# Patient Record
Sex: Male | Born: 1970 | Race: White | Hispanic: No | Marital: Single | Smoking: Former smoker
Health system: Southern US, Community
[De-identification: ages and names within clinical notes are randomized; demographics above are authoritative.]

## PROBLEM LIST (undated history)

## (undated) DIAGNOSIS — G43909 Migraine, unspecified, not intractable, without status migrainosus: Secondary | ICD-10-CM

## (undated) DIAGNOSIS — K219 Gastro-esophageal reflux disease without esophagitis: Secondary | ICD-10-CM

## (undated) DIAGNOSIS — R569 Unspecified convulsions: Secondary | ICD-10-CM

## (undated) DIAGNOSIS — C801 Malignant (primary) neoplasm, unspecified: Secondary | ICD-10-CM

## (undated) HISTORY — DX: Migraine, unspecified, not intractable, without status migrainosus: G43.909

## (undated) HISTORY — DX: Gastro-esophageal reflux disease without esophagitis: K21.9

---

## 1998-01-28 ENCOUNTER — Encounter: Payer: Self-pay | Admitting: *Deleted

## 1998-01-28 ENCOUNTER — Emergency Department (HOSPITAL_COMMUNITY): Admission: EM | Admit: 1998-01-28 | Discharge: 1998-01-28 | Payer: Self-pay | Admitting: *Deleted

## 2003-05-09 ENCOUNTER — Emergency Department (HOSPITAL_COMMUNITY): Admission: EM | Admit: 2003-05-09 | Discharge: 2003-05-09 | Payer: Self-pay

## 2006-06-22 ENCOUNTER — Emergency Department: Payer: Self-pay | Admitting: Emergency Medicine

## 2007-04-03 ENCOUNTER — Emergency Department (HOSPITAL_COMMUNITY): Admission: EM | Admit: 2007-04-03 | Discharge: 2007-04-03 | Payer: Self-pay | Admitting: Emergency Medicine

## 2007-05-12 ENCOUNTER — Emergency Department (HOSPITAL_COMMUNITY): Admission: EM | Admit: 2007-05-12 | Discharge: 2007-05-12 | Payer: Self-pay | Admitting: Emergency Medicine

## 2008-12-14 ENCOUNTER — Inpatient Hospital Stay: Payer: Self-pay | Admitting: Internal Medicine

## 2009-12-16 ENCOUNTER — Emergency Department: Payer: Self-pay | Admitting: Emergency Medicine

## 2010-07-08 ENCOUNTER — Emergency Department: Payer: Self-pay | Admitting: Emergency Medicine

## 2010-10-05 ENCOUNTER — Emergency Department: Payer: Self-pay | Admitting: Internal Medicine

## 2011-01-16 LAB — WOUND CULTURE

## 2011-04-22 ENCOUNTER — Emergency Department: Payer: Self-pay

## 2011-06-06 ENCOUNTER — Emergency Department: Payer: Self-pay | Admitting: Emergency Medicine

## 2011-06-06 LAB — DRUG SCREEN, URINE
Amphetamines, Ur Screen: NEGATIVE (ref ?–1000)
Benzodiazepine, Ur Scrn: NEGATIVE (ref ?–200)
Cannabinoid 50 Ng, Ur ~~LOC~~: NEGATIVE (ref ?–50)
MDMA (Ecstasy)Ur Screen: NEGATIVE (ref ?–500)
Opiate, Ur Screen: NEGATIVE (ref ?–300)
Tricyclic, Ur Screen: NEGATIVE (ref ?–1000)

## 2011-06-06 LAB — COMPREHENSIVE METABOLIC PANEL
Albumin: 3.8 g/dL (ref 3.4–5.0)
Anion Gap: 12 (ref 7–16)
BUN: 11 mg/dL (ref 7–18)
Bilirubin,Total: 0.2 mg/dL (ref 0.2–1.0)
Calcium, Total: 8.2 mg/dL — ABNORMAL LOW (ref 8.5–10.1)
Chloride: 105 mmol/L (ref 98–107)
Creatinine: 1.02 mg/dL (ref 0.60–1.30)
EGFR (African American): >60
EGFR (Non-African Amer.): >60
Glucose: 151 mg/dL — ABNORMAL HIGH (ref 65–99)
Osmolality: 284 (ref 275–301)
Potassium: 3.8 mmol/L (ref 3.5–5.1)
SGPT (ALT): 23 U/L
Total Protein: 6.7 g/dL (ref 6.4–8.2)

## 2011-06-06 LAB — URINALYSIS, COMPLETE
Bilirubin,UR: NEGATIVE
Blood: NEGATIVE
Glucose,UR: NEGATIVE mg/dL (ref 0–75)
Leukocyte Esterase: NEGATIVE
Ph: 6 (ref 4.5–8.0)
Protein: NEGATIVE
Squamous Epithelial: NONE SEEN

## 2011-06-06 LAB — CBC
HCT: 39.1 % — ABNORMAL LOW (ref 40.0–52.0)
HGB: 13.3 g/dL (ref 13.0–18.0)
MCH: 32.7 pg (ref 26.0–34.0)
MCHC: 34 g/dL (ref 32.0–36.0)
MCV: 96 fL (ref 80–100)
RDW: 13.2 % (ref 11.5–14.5)

## 2011-06-06 LAB — CK TOTAL AND CKMB (NOT AT ARMC)
CK, Total: 263 U/L — ABNORMAL HIGH (ref 35–232)
CK-MB: 1.2 ng/mL (ref 0.5–3.6)

## 2011-06-06 LAB — PHENYTOIN LEVEL, TOTAL: Dilantin: <0.4 ug/mL — ABNORMAL LOW (ref 10.0–20.0)

## 2012-04-27 ENCOUNTER — Ambulatory Visit: Payer: Self-pay | Admitting: Neurology

## 2013-02-09 ENCOUNTER — Ambulatory Visit (INDEPENDENT_AMBULATORY_CARE_PROVIDER_SITE_OTHER): Payer: Self-pay

## 2013-02-09 ENCOUNTER — Other Ambulatory Visit: Payer: Self-pay | Admitting: Adult Health

## 2013-02-09 DIAGNOSIS — R52 Pain, unspecified: Secondary | ICD-10-CM

## 2013-02-09 DIAGNOSIS — M545 Low back pain: Secondary | ICD-10-CM

## 2013-09-18 ENCOUNTER — Encounter (HOSPITAL_COMMUNITY): Payer: Self-pay | Admitting: Emergency Medicine

## 2013-09-18 ENCOUNTER — Emergency Department (HOSPITAL_COMMUNITY)
Admission: EM | Admit: 2013-09-18 | Discharge: 2013-09-18 | Disposition: A | Discharge status: Home / Self Care | Payer: BC Managed Care – PPO | Attending: Emergency Medicine | Admitting: Emergency Medicine

## 2013-09-18 ENCOUNTER — Emergency Department (HOSPITAL_COMMUNITY): Payer: BC Managed Care – PPO

## 2013-09-18 DIAGNOSIS — S2231XA Fracture of one rib, right side, initial encounter for closed fracture: Secondary | ICD-10-CM

## 2013-09-18 DIAGNOSIS — S99919A Unspecified injury of unspecified ankle, initial encounter: Secondary | ICD-10-CM

## 2013-09-18 DIAGNOSIS — S99929A Unspecified injury of unspecified foot, initial encounter: Secondary | ICD-10-CM

## 2013-09-18 DIAGNOSIS — F17200 Nicotine dependence, unspecified, uncomplicated: Secondary | ICD-10-CM | POA: Insufficient documentation

## 2013-09-18 DIAGNOSIS — Z23 Encounter for immunization: Secondary | ICD-10-CM | POA: Insufficient documentation

## 2013-09-18 DIAGNOSIS — Y92410 Unspecified street and highway as the place of occurrence of the external cause: Secondary | ICD-10-CM | POA: Insufficient documentation

## 2013-09-18 DIAGNOSIS — S8990XA Unspecified injury of unspecified lower leg, initial encounter: Secondary | ICD-10-CM | POA: Insufficient documentation

## 2013-09-18 DIAGNOSIS — M25561 Pain in right knee: Secondary | ICD-10-CM

## 2013-09-18 DIAGNOSIS — S20219A Contusion of unspecified front wall of thorax, initial encounter: Secondary | ICD-10-CM | POA: Insufficient documentation

## 2013-09-18 DIAGNOSIS — Y9389 Activity, other specified: Secondary | ICD-10-CM | POA: Insufficient documentation

## 2013-09-18 HISTORY — DX: Unspecified convulsions: R56.9

## 2013-09-18 MED ORDER — OXYCODONE-ACETAMINOPHEN 5-325 MG PO TABS: 1.0000 | ORAL_TABLET | Freq: Four times a day (QID) | ORAL | Status: DC | PRN

## 2013-09-18 MED ORDER — TETANUS-DIPHTH-ACELL PERTUSSIS 5-2.5-18.5 LF-MCG/0.5 IM SUSP
0.5000 mL | Freq: Once | INTRAMUSCULAR | Status: AC
Start: 2013-09-18 — End: 2013-09-18
  Administered 2013-09-18: 0.5 mL via INTRAMUSCULAR
  Filled 2013-09-18: qty 0.5

## 2013-09-18 MED ORDER — OXYCODONE-ACETAMINOPHEN 5-325 MG PO TABS
2.0000 | ORAL_TABLET | Freq: Once | ORAL | Status: AC
  Administered 2013-09-18: 2 via ORAL
  Filled 2013-09-18: qty 2

## 2013-09-18 MED ORDER — HYDROCODONE-ACETAMINOPHEN 7.5-325 MG/15ML PO SOLN: 10.0000 mL | Freq: Once | ORAL | Status: DC

## 2013-09-18 MED ORDER — IBUPROFEN 800 MG PO TABS: 800.0000 mg | ORAL_TABLET | Freq: Three times a day (TID) | ORAL | Status: DC

## 2013-09-18 NOTE — Discharge Instructions (Signed)
Please follow up with your primary care physician in 1-2 days. If you do not have one please call the [facility] number listed above. Please follow up with Dr. Rhona Raider to schedule a follow up appointment if your knee is not improving. Please follow RICE method below. Please take pain medication and/or muscle relaxants as prescribed and as needed for pain. Please do not drive on narcotic pain medication or on muscle relaxants. Please read all discharge instructions and return precautions.   Rib Fracture A rib fracture is a break or crack in one of the bones of the ribs. The ribs are a group of long, curved bones that wrap around your chest and attach to your spine. They protect your lungs and other organs in the chest cavity. A broken or cracked rib is often painful, but most do not cause other problems. Most rib fractures heal on their own over time. However, rib fractures can be more serious if multiple ribs are broken or if broken ribs move out of place and push against other structures. CAUSES   A direct blow to the chest. For example, this could happen during contact sports, a car accident, or a fall against a hard object.  Repetitive movements with high force, such as pitching a baseball or having severe coughing spells. SYMPTOMS   Pain when you breathe in or cough.  Pain when someone presses on the injured area. DIAGNOSIS  Your caregiver will perform a physical exam. Various imaging tests may be ordered to confirm the diagnosis and to look for related injuries. These tests may include a chest X-ray, computed tomography (CT), magnetic resonance imaging (MRI), or a bone scan. TREATMENT  Rib fractures usually heal on their own in 1 3 months. The longer healing period is often associated with a continued cough or other aggravating activities. During the healing period, pain control is very important. Medication is usually given to control pain. Hospitalization or surgery may  be needed for more severe injuries, such as those in which multiple ribs are broken or the ribs have moved out of place.  HOME CARE INSTRUCTIONS   Avoid strenuous activity and any activities or movements that cause pain. Be careful during activities and avoid bumping the injured rib.  Gradually increase activity as directed by your caregiver.  Only take over-the-counter or prescription medications as directed by your caregiver. Do not take other medications without asking your caregiver first.  Apply ice to the injured area for the first 1 2 days after you have been treated or as directed by your caregiver. Applying ice helps to reduce inflammation and pain.  Put ice in a plastic bag.  Place a towel between your skin and the bag.   Leave the ice on for 15 20 minutes at a time, every 2 hours while you are awake.  Perform deep breathing as directed by your caregiver. This will help prevent pneumonia, which is a common complication of a broken rib. Your caregiver may instruct you to:  Take deep breaths several times a day.  Try to cough several times a day, holding a pillow against the injured area.  Use a device called an incentive spirometer to practice deep breathing several times a day.  Drink enough fluids to keep your urine clear or pale yellow. This will help you avoid constipation.   Do not wear a rib belt or binder. These restrict breathing, which can lead to pneumonia.  SEEK IMMEDIATE MEDICAL CARE IF:  You have a fever.   You have difficulty breathing or shortness of breath.   You develop a continual cough, or you cough up thick or bloody sputum.  You feel sick to your stomach (nausea), throw up (vomit), or have abdominal pain.   You have worsening pain not controlled with medications.  MAKE SURE YOU:  Understand these instructions.  Will watch your condition.  Will get help right away if you are not doing well or get worse. Document Released: 03/30/2005  Document Revised: 11/30/2012 Document Reviewed: 06/01/2012 [facility] Patient Information 2014 [place], [place].   Knee Pain Knee pain can be a result of an injury or other medical conditions. Treatment will depend on the cause of your pain. HOME CARE  Only take medicine as told by your doctor.  Keep a healthy weight. Being overweight can make the knee hurt more.  Stretch before exercising or playing sports.  If there is constant knee pain, change the way you exercise. Ask your doctor for advice.  Make sure shoes fit well. Choose the right shoe for the sport or activity.  Protect your knees. Wear kneepads if needed.  Rest when you are tired. GET HELP RIGHT AWAY IF:   Your knee pain does not stop.  Your knee pain does not get better.  Your knee joint feels hot to the touch.  You have a fever. MAKE SURE YOU:   Understand these instructions.  Will watch this condition.  Will get help right away if you are not doing well or get worse. Document Released: 06/26/2008 Document Revised: 06/22/2011 Document Reviewed: 06/26/2008 [facility] Patient Information 2014 [address], [place].

## 2013-09-18 NOTE — ED Notes (Signed)
PA notified of elevated BP.
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
## 2013-09-21 NOTE — ED Provider Notes (Signed)
Medical screening examination/treatment/procedure(s) were performed by non-physician practitioner and as supervising physician I was immediately available for consultation/collaboration.   EKG Interpretation None       Varney Biles, MD 09/21/13 0104

## 2013-10-31 DIAGNOSIS — R519 Headache, unspecified: Secondary | ICD-10-CM | POA: Insufficient documentation

## 2013-10-31 DIAGNOSIS — R569 Unspecified convulsions: Secondary | ICD-10-CM | POA: Insufficient documentation

## 2014-06-21 DIAGNOSIS — G479 Sleep disorder, unspecified: Secondary | ICD-10-CM | POA: Insufficient documentation

## 2014-10-19 DIAGNOSIS — F10939 Alcohol use, unspecified with withdrawal, unspecified: Secondary | ICD-10-CM | POA: Insufficient documentation

## 2015-08-31 ENCOUNTER — Encounter (HOSPITAL_COMMUNITY): Payer: Self-pay

## 2015-08-31 ENCOUNTER — Emergency Department (HOSPITAL_COMMUNITY): Payer: Self-pay

## 2015-08-31 ENCOUNTER — Emergency Department (HOSPITAL_COMMUNITY)
Admission: EM | Admit: 2015-08-31 | Discharge: 2015-08-31 | Disposition: A | Discharge status: Home / Self Care | Payer: Self-pay | Attending: Emergency Medicine | Admitting: Emergency Medicine

## 2015-08-31 DIAGNOSIS — Z79899 Other long term (current) drug therapy: Secondary | ICD-10-CM | POA: Insufficient documentation

## 2015-08-31 DIAGNOSIS — R200 Anesthesia of skin: Secondary | ICD-10-CM | POA: Insufficient documentation

## 2015-08-31 DIAGNOSIS — R269 Unspecified abnormalities of gait and mobility: Secondary | ICD-10-CM | POA: Insufficient documentation

## 2015-08-31 DIAGNOSIS — M79605 Pain in left leg: Secondary | ICD-10-CM

## 2015-08-31 DIAGNOSIS — R000 Tachycardia, unspecified: Secondary | ICD-10-CM | POA: Insufficient documentation

## 2015-08-31 DIAGNOSIS — M79672 Pain in left foot: Secondary | ICD-10-CM | POA: Insufficient documentation

## 2015-08-31 DIAGNOSIS — Z9889 Other specified postprocedural states: Secondary | ICD-10-CM | POA: Insufficient documentation

## 2015-08-31 DIAGNOSIS — R062 Wheezing: Secondary | ICD-10-CM | POA: Insufficient documentation

## 2015-08-31 DIAGNOSIS — F17200 Nicotine dependence, unspecified, uncomplicated: Secondary | ICD-10-CM | POA: Insufficient documentation

## 2015-08-31 MED ORDER — HYDROCODONE-ACETAMINOPHEN 5-325 MG PO TABS: 2.0000 | ORAL_TABLET | ORAL | Status: DC | PRN

## 2015-08-31 MED ORDER — ONDANSETRON HCL 4 MG/2ML IJ SOLN
INTRAMUSCULAR | Status: AC
  Filled 2015-08-31: qty 2

## 2015-08-31 MED ORDER — HYDROMORPHONE HCL 1 MG/ML IJ SOLN
1.0000 mg | Freq: Once | INTRAMUSCULAR | Status: AC
  Administered 2015-08-31: 1 mg via INTRAMUSCULAR
  Filled 2015-08-31: qty 1

## 2015-08-31 MED ORDER — SODIUM CHLORIDE 0.9 % IV BOLUS (SEPSIS)
1000.0000 mL | Freq: Once | INTRAVENOUS | Status: AC
  Administered 2015-08-31: 1000 mL via INTRAVENOUS

## 2015-08-31 MED ORDER — KETOROLAC TROMETHAMINE 30 MG/ML IJ SOLN
30.0000 mg | Freq: Once | INTRAMUSCULAR | Status: AC
  Administered 2015-08-31: 30 mg via INTRAVENOUS
  Filled 2015-08-31: qty 1

## 2015-08-31 MED ORDER — ONDANSETRON HCL 4 MG/2ML IJ SOLN
4.0000 mg | Freq: Once | INTRAMUSCULAR | Status: AC
  Administered 2015-08-31: 4 mg via INTRAVENOUS

## 2015-08-31 MED ORDER — HYDROMORPHONE HCL 1 MG/ML IJ SOLN
1.0000 mg | Freq: Once | INTRAMUSCULAR | Status: AC
  Administered 2015-08-31: 1 mg via INTRAVENOUS
  Filled 2015-08-31: qty 1

## 2015-08-31 NOTE — ED Notes (Signed)
Pt states that he is nauseous since walking to the restroom. PA notified and gave verbal order for zofran. Pt is not vomiting.

## 2015-08-31 NOTE — ED Notes (Signed)
Patient here with left ankle/lower leg pain after feeding animals this afternoon. Denies fall but did kick bucket

## 2015-08-31 NOTE — Discharge Instructions (Signed)

## 2015-08-31 NOTE — ED Notes (Signed)
Pt verbalized understanding of d/c instructions and has no further questions. Pt stable and NAD. Pt d/c home with mother driving.

## 2015-08-31 NOTE — Progress Notes (Signed)
Orthopedic Tech Progress Note Patient Details:  Matthew Nguyen 28-Mar-1971 [id number]  Ortho Devices Type of Ortho Device: CAM walker, Crutches Ortho Device/Splint Interventions: Application   [name] 08/31/2015, 6:29 PM

## 2015-08-31 NOTE — ED Notes (Signed)
Pt given ice water to drink

## 2015-08-31 NOTE — ED Provider Notes (Signed)
CSN: [id number]     Arrival date & time 08/31/15  1542 History   First MD Initiated Contact with Patient 08/31/15 1611     Chief Complaint  Patient presents with  . ankle injury    HPI Comments: 45 year old male presents with acute onset of left foot pain for the past several hours. Past medical history significant for surgery on his left foot ligaments following an injury about 20 years ago. He states that he was walking outside when he started to have intense pain in his foot. The pain radiates up his leg. It is sharp and stabbing with some numbness and tingling. He was unable to bear weight afterwards. Denies injury. He has never had this pain before.   Past Medical History  Diagnosis Date  . Seizure [facility])    History reviewed. No pertinent past surgical history. No family history on file. Social History  Substance Use Topics  . Smoking status: Current Every Day Smoker  . Smokeless tobacco: None  . Alcohol Use: Yes    Review of Systems  Musculoskeletal: Positive for arthralgias and gait problem.  Neurological: Positive for numbness.  All other systems reviewed and are negative.     Allergies  Morphine  Home Medications   Prior to Admission medications   Medication Sig Start Date End Date Taking? Authorizing Provider  amitriptyline (ELAVIL) 100 MG tablet Take 100 mg by mouth at bedtime. 04/25/15  Yes Historical Provider, MD  Aspirin-Acetaminophen-Caffeine 260-130-16 MG TABS Take 2 packets by mouth daily as needed (for headaches).   Yes Historical Provider, MD  lamoTRIgine (LAMICTAL) 200 MG tablet Take 200 mg by mouth 2 (two) times daily. 07/05/15  Yes Historical Provider, MD  levETIRAcetam (KEPPRA) 750 MG tablet Take 1,500 mg by mouth 2 (two) times daily. 08/12/15  Yes Historical Provider, MD  venlafaxine (EFFEXOR) 75 MG tablet Take 75 mg by mouth every morning.  08/12/15  Yes Historical Provider, MD  zolpidem (AMBIEN) 5 MG tablet Take 5 mg by mouth at bedtime. 08/12/15 09/11/15 Yes  Historical Provider, MD  HYDROcodone-acetaminophen (NORCO/VICODIN) 5-325 MG tablet Take 2 tablets by mouth every 4 (four) hours as needed. 08/31/15   Recardo Evangelist, PA-C   BP 149/102 mmHg  Pulse 96  Temp(Src) 99.6 F (37.6 C) (Oral)  Resp 16  SpO2 97%   Physical Exam  Constitutional: He is oriented to person, place, and time. He appears well-developed and well-nourished. He appears distressed.  HENT:  Head: Normocephalic and atraumatic.  Eyes: Conjunctivae are normal. Pupils are equal, round, and reactive to light. Right eye exhibits no discharge. Left eye exhibits no discharge. No scleral icterus.  Neck: Normal range of motion.  Cardiovascular: Regular rhythm.  Tachycardia present.  Exam reveals no gallop and no friction rub.   No murmur heard. Pulmonary/Chest: Effort normal. No respiratory distress. He has wheezes. He has no rales. He exhibits no tenderness.  Mild expiratory wheezes  Musculoskeletal:  Left foot, ankle, leg: No obvious deformity or swelling. No pallor. Foot is not cold to touch. N/V intact. 1+DP and PT pulse. FROM of toes and ankle. No calf tenderness, swelling, or erythema.  Neurological: He is alert and oriented to person, place, and time.  Skin: Skin is warm and dry. He is not diaphoretic.  Psychiatric: He has a normal mood and affect.  Nursing note and vitals reviewed.   ED Course  Procedures (including critical care time) Labs Review Labs Reviewed - No data to display  Imaging Review Dg Tibia/fibula Left  08/31/2015  CLINICAL DATA:  Left ankle pain EXAM: LEFT TIBIA AND FIBULA - 2 VIEW COMPARISON:  None. FINDINGS: Distal tibia/fibula is excluded but evaluated on dedicated ankle radiographs. No fracture or dislocation is seen. Knee joint spaces are preserved. Vascular calcifications. IMPRESSION: No fracture or dislocation is seen. Distal tibia/fibula is evaluated on dedicated ankle radiographs. Electronically Signed   By: Julian Hy M.D.   On:  08/31/2015 17:18   Dg Ankle Complete Left  08/31/2015  CLINICAL DATA:  Pain after fall a few days ago. EXAM: LEFT ANKLE COMPLETE - 3+ VIEW COMPARISON:  None. FINDINGS: Well corticated calcifications distal to the fibula consistent with an remote avulsion injury. No acute fractures or dislocations are identified. IMPRESSION: No acute fracture. Electronically Signed   By: Dorise Bullion III M.D   On: 08/31/2015 17:15   Dg Foot Complete Left  08/31/2015  CLINICAL DATA:  Acute onset of left foot pain earlier today. No known injuries. EXAM: LEFT FOOT - COMPLETE 3+ VIEW COMPARISON:  None. FINDINGS: No evidence of acute fracture or dislocation. Joint spaces well preserved. Well-preserved bone mineral density. No intrinsic osseous abnormalities. IMPRESSION: Normal examination. Electronically Signed   By: Evangeline Dakin M.D.   On: 08/31/2015 19:55   I have personally reviewed and evaluated these images and lab results as part of my medical decision-making.   EKG Interpretation None      MDM   Final diagnoses:  Left leg pain   45 year old male presents with acute onset of left foot pain. Unclear etiology. Pain is possibly due to his old injury. X-rays are negative for any acute processes. He is tachycardic initially however this has improved with 1 L normal saline. All other vital signs are within normal limits and stable. Dialudid x 2 given with minimal relief. Pt is nauseous after narcotic meds. Zofran given. Recommend ortho follow up. Patient is NAD, non-toxic, with stable VS. Patient is informed of clinical course, understands medical decision making process, and agrees with plan. Opportunity for questions provided and all questions answered. Return precautions given.    Recardo Evangelist, PA-C 08/31/15 2011  Nat Christen, MD 08/31/15 [id number]

## 2015-08-31 NOTE — ED Notes (Signed)
Ortho in room

## 2015-10-29 DIAGNOSIS — R569 Unspecified convulsions: Secondary | ICD-10-CM | POA: Insufficient documentation

## 2016-05-05 DIAGNOSIS — G40219 Localization-related (focal) (partial) symptomatic epilepsy and epileptic syndromes with complex partial seizures, intractable, without status epilepticus: Secondary | ICD-10-CM | POA: Insufficient documentation

## 2017-08-31 ENCOUNTER — Encounter: Payer: Self-pay | Admitting: Neurology

## 2017-10-25 ENCOUNTER — Encounter: Payer: Self-pay | Admitting: Neurology

## 2017-10-25 ENCOUNTER — Encounter

## 2017-10-25 ENCOUNTER — Ambulatory Visit (INDEPENDENT_AMBULATORY_CARE_PROVIDER_SITE_OTHER): Payer: Medicare Other | Admitting: Neurology

## 2017-10-25 ENCOUNTER — Other Ambulatory Visit: Payer: Self-pay

## 2017-10-25 VITALS — BP 148/92 | HR 123 | Ht 68.5 in | Wt 159.0 lb

## 2017-10-25 DIAGNOSIS — R51 Headache: Secondary | ICD-10-CM | POA: Diagnosis not present

## 2017-10-25 DIAGNOSIS — G40009 Localization-related (focal) (partial) idiopathic epilepsy and epileptic syndromes with seizures of localized onset, not intractable, without status epilepticus: Secondary | ICD-10-CM

## 2017-10-25 DIAGNOSIS — R519 Headache, unspecified: Secondary | ICD-10-CM

## 2017-10-25 MED ORDER — SUMATRIPTAN SUCCINATE 50 MG PO TABS: ORAL_TABLET | ORAL | 6 refills | Status: DC

## 2017-10-25 MED ORDER — ZONISAMIDE 100 MG PO CAPS: ORAL_CAPSULE | ORAL | 6 refills | Status: DC
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
## 2017-10-27 DIAGNOSIS — G40009 Localization-related (focal) (partial) idiopathic epilepsy and epileptic syndromes with seizures of localized onset, not intractable, without status epilepticus: Secondary | ICD-10-CM | POA: Insufficient documentation

## 2017-10-27 DIAGNOSIS — R519 Headache, unspecified: Secondary | ICD-10-CM | POA: Insufficient documentation

## 2017-10-27 DIAGNOSIS — R51 Headache: Secondary | ICD-10-CM

## 2017-11-19 ENCOUNTER — Ambulatory Visit
Admission: RE | Admit: 2017-11-19 | Discharge: 2017-11-19 | Disposition: A | Discharge status: Home / Self Care | Payer: Medicare Other | Source: Ambulatory Visit | Attending: Neurology | Admitting: Neurology

## 2017-11-19 ENCOUNTER — Other Ambulatory Visit: Payer: Self-pay | Admitting: Neurology

## 2017-11-19 DIAGNOSIS — Z77018 Contact with and (suspected) exposure to other hazardous metals: Secondary | ICD-10-CM

## 2017-11-19 DIAGNOSIS — G40009 Localization-related (focal) (partial) idiopathic epilepsy and epileptic syndromes with seizures of localized onset, not intractable, without status epilepticus: Secondary | ICD-10-CM

## 2017-11-19 DIAGNOSIS — Z135 Encounter for screening for eye and ear disorders: Secondary | ICD-10-CM | POA: Diagnosis not present

## 2017-11-19 DIAGNOSIS — R51 Headache: Secondary | ICD-10-CM | POA: Diagnosis not present

## 2017-11-19 MED ORDER — GADOBENATE DIMEGLUMINE 529 MG/ML IV SOLN
15.0000 mL | Freq: Once | INTRAVENOUS | Status: AC | PRN
  Administered 2017-11-19: 15 mL via INTRAVENOUS

## 2017-11-22 ENCOUNTER — Telehealth: Payer: Self-pay | Admitting: Neurology

## 2017-11-22 MED ORDER — AMITRIPTYLINE HCL 50 MG PO TABS: ORAL_TABLET | ORAL | 11 refills | Status: DC

## 2017-11-22 NOTE — Telephone Encounter (Signed)
Ok to restart amitriptyline, let's do amitriptyline 50mg  qhs 1 tab qhs x 1 week, then increase to 2 tabs qhs. He was previously taking 100mg  at bedtime but would start on lower dose first because he has not taken it in a while. Also pls remind to minimize and cut down on BC powders otherwise headaches will never get better whatever medication we add on. Thanks

## 2017-11-22 NOTE — Telephone Encounter (Signed)
Patient called to say that the Sumatriptan is not helping his Headaches. He said that the Amitriptyline did help in the past. He uses [facility] in [place].  Please Call. Thanks

## 2017-11-22 NOTE — Telephone Encounter (Signed)
Spoke with pt relaying message below as well as his MRI results.  Verified preferred pharmacy.  Rx sent

## 2017-11-22 NOTE — Telephone Encounter (Signed)
pls advise

## 2018-01-18 ENCOUNTER — Other Ambulatory Visit: Payer: Self-pay

## 2018-01-18 ENCOUNTER — Telehealth: Payer: Self-pay | Admitting: Neurology

## 2018-01-18 MED ORDER — ZONISAMIDE 100 MG PO CAPS: ORAL_CAPSULE | ORAL | 6 refills | Status: DC

## 2018-01-18 MED ORDER — SUMATRIPTAN SUCCINATE 50 MG PO TABS: ORAL_TABLET | ORAL | 6 refills | Status: DC

## 2018-01-18 MED ORDER — AMITRIPTYLINE HCL 50 MG PO TABS: ORAL_TABLET | ORAL | 11 refills | Status: DC

## 2018-01-18 NOTE — Telephone Encounter (Signed)
Patient's sister called and had to reschedule his appointment until January. She wanted to know could he still have his medication refilled until that appointment? He uses the pharmacy in [facility]. Thanks

## 2018-01-18 NOTE — Telephone Encounter (Signed)
Rxs sent

## 2018-01-20 ENCOUNTER — Ambulatory Visit: Payer: Medicare Other | Admitting: Neurology

## 2018-05-05 ENCOUNTER — Ambulatory Visit (INDEPENDENT_AMBULATORY_CARE_PROVIDER_SITE_OTHER): Payer: Medicare HMO | Admitting: Neurology

## 2018-05-05 VITALS — BP 130/84 | HR 111 | Ht 69.0 in | Wt 160.0 lb

## 2018-05-05 DIAGNOSIS — R51 Headache: Secondary | ICD-10-CM

## 2018-05-05 DIAGNOSIS — R519 Headache, unspecified: Secondary | ICD-10-CM

## 2018-05-05 DIAGNOSIS — G40009 Localization-related (focal) (partial) idiopathic epilepsy and epileptic syndromes with seizures of localized onset, not intractable, without status epilepticus: Secondary | ICD-10-CM

## 2018-05-05 MED ORDER — NARATRIPTAN HCL 2.5 MG PO TABS: ORAL_TABLET | ORAL | 11 refills | Status: DC

## 2018-05-05 MED ORDER — AMITRIPTYLINE HCL 50 MG PO TABS: ORAL_TABLET | ORAL | 11 refills | Status: DC

## 2018-05-05 MED ORDER — ZONISAMIDE 100 MG PO CAPS: ORAL_CAPSULE | ORAL | 11 refills | Status: DC
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
## 2018-05-11 ENCOUNTER — Encounter: Payer: Self-pay | Admitting: Neurology

## 2018-05-26 ENCOUNTER — Encounter: Payer: Self-pay | Admitting: General Surgery

## 2018-05-26 ENCOUNTER — Ambulatory Visit: Payer: Medicare HMO | Admitting: General Surgery

## 2018-05-26 VITALS — BP 133/100 | HR 111 | Temp 98.6°F | Resp 18 | Wt 159.8 lb

## 2018-05-26 DIAGNOSIS — K429 Umbilical hernia without obstruction or gangrene: Secondary | ICD-10-CM

## 2018-05-26 MED ORDER — OXYCODONE HCL 5 MG PO TABS: 5.0000 mg | ORAL_TABLET | ORAL | 0 refills | Status: DC | PRN

## 2018-05-26 NOTE — Patient Instructions (Signed)
Umbilical Hernia, Adult  A hernia is a bulge of tissue that pushes through an opening between muscles. An umbilical hernia happens in the abdomen, near the belly button (umbilicus). The hernia may contain tissues from the small intestine, large intestine, or fatty tissue covering the intestines (omentum). Umbilical hernias in adults tend to get worse over time, and they require surgical treatment. There are several types of umbilical hernias. You may have:  A hernia located just above or below the umbilicus (indirect hernia). This is the most common type of umbilical hernia in adults.  A hernia that forms through an opening formed by the umbilicus (direct hernia).  A hernia that comes and goes (reducible hernia). A reducible hernia may be visible only when you strain, lift something heavy, or cough. This type of hernia can be pushed back into the abdomen (reduced).  A hernia that traps abdominal tissue inside the hernia (incarcerated hernia). This type of hernia cannot be reduced.  A hernia that cuts off blood flow to the tissues inside the hernia (strangulated hernia). The tissues can start to die if this happens. This type of hernia requires emergency treatment. What are the causes? An umbilical hernia happens when tissue inside the abdomen presses on a weak area of the abdominal muscles. What increases the risk? You may have a greater risk of this condition if you:  Are obese.  Have had several pregnancies.  Have a buildup of fluid inside your abdomen (ascites).  Have had surgery that weakens the abdominal muscles. What are the signs or symptoms? The main symptom of this condition is a painless bulge at or near the belly button. A reducible hernia may be visible only when you strain, lift something heavy, or cough. Other symptoms may include:  Dull pain.  A feeling of pressure. Symptoms of a strangulated hernia may include:  Pain that gets increasingly worse.  Nausea and  vomiting.  Pain when pressing on the hernia.  Skin over the hernia becoming red or purple.  Constipation.  Blood in the stool. How is this diagnosed? This condition may be diagnosed based on:  A physical exam. You may be asked to cough or strain while standing. These actions increase the pressure inside your abdomen and force the hernia through the opening in your muscles. Your health care provider may try to reduce the hernia by pressing on it.  Your symptoms and medical history. How is this treated? Surgery is the only treatment for an umbilical hernia. Surgery for a strangulated hernia is done as soon as possible. If you have a small hernia that is not incarcerated, you may need to lose weight before having surgery. Follow these instructions at home:  Lose weight, if told by your health care provider.  Do not try to push the hernia back in.  Watch your hernia for any changes in color or size. Tell your health care provider if any changes occur.  You may need to avoid activities that increase pressure on your hernia.  Do not lift anything that is heavier than 10 lb (4.5 kg) until your health care provider says that this is safe.  Take over-the-counter and prescription medicines only as told by your health care provider.  Keep all follow-up visits as told by your health care provider. This is important. Contact a health care provider if:  Your hernia gets larger.  Your hernia becomes painful. Get help right away if:  You develop sudden, severe pain near the area of your hernia.    You have pain as well as nausea or vomiting.  You have pain and the skin over your hernia changes color.  You develop a fever. This information is not intended to replace advice given to you by your health care provider. Make sure you discuss any questions you have with your health care provider. Document Released: 08/30/2015 Document Revised: 05/12/2017 Document Reviewed: 09/28/2016 Elsevier  Interactive Patient Education  2019 Elsevier Inc.  

## 2018-05-26 NOTE — Progress Notes (Signed)
Matthew Nguyen [place]; [id number]; July 31, 1970   HPI Patient is a 48 year old white male who was referred to my care by Dr. Yong Channel for evaluation treatment of an umbilical hernia.  Patient states he has had an umbilical hernia for many years.  Over the past year, he is noted decreasing in size of the hernia.  Is causing him discomfort especially when straining or coughing.  No nausea or vomiting have been noted.  He states his pain is 2 out of 10. Past Medical History:  Diagnosis Date  . Seizure [facility])     History reviewed. No pertinent surgical history.  History reviewed. No pertinent family history.  Current Outpatient Medications on File Prior to Visit  Medication Sig Dispense Refill  . amitriptyline (ELAVIL) 50 MG tablet Take 3 tablets every night 90 tablet 11  . naratriptan (AMERGE) 2.5 MG tablet Take 1 tablet at onset of headache. Do not take more than 2-3 a week 10 tablet 11  . zolpidem (AMBIEN) 5 MG tablet Take 5 mg by mouth at bedtime.    Marland Kitchen zonisamide (ZONEGRAN) 100 MG capsule Take 4 caps every night 120 capsule 11   No current facility-administered medications on file prior to visit.     Allergies  Allergen Reactions  . Morphine Nausea Only    Social History   Substance and Sexual Activity  Alcohol Use Yes    Social History   Tobacco Use  Smoking Status Current Every Day Smoker  Smokeless Tobacco Never Used    Review of Systems  Constitutional: Negative.   HENT: Negative.   Eyes: Negative.   Respiratory: Negative.   Cardiovascular: Negative.   Gastrointestinal: Positive for abdominal pain.  Genitourinary: Negative.   Musculoskeletal: Negative.   Skin: Negative.   Neurological: Positive for seizures.  Endo/Heme/Allergies: Negative.   Psychiatric/Behavioral: Negative.     Objective   Vitals:   05/26/18 1034  BP: (!) 133/100  Pulse: (!) 111  Resp: 18  Temp: 98.6 F (37 C)    Physical Exam Vitals signs reviewed.  Constitutional:    Appearance: Normal appearance. He is normal weight. He is not ill-appearing.  HENT:     Head: Normocephalic and atraumatic.  Cardiovascular:     Rate and Rhythm: Normal rate and regular rhythm.     Heart sounds: Normal heart sounds. No murmur. No friction rub. No gallop.   Pulmonary:     Effort: Pulmonary effort is normal. No respiratory distress.     Breath sounds: Normal breath sounds. No stridor. No wheezing, rhonchi or rales.  Abdominal:     General: Abdomen is flat. Bowel sounds are normal. There is no distension.     Palpations: Abdomen is soft. There is no mass.     Tenderness: There is no abdominal tenderness. There is no guarding or rebound.     Hernia: A hernia is present.     Comments: Reducible umbilical hernia present.  Skin:    General: Skin is warm and dry.  Neurological:     Mental Status: He is alert and oriented to person, place, and time.   Primary care notes reviewed  Assessment  Umbilical hernia Plan   Patient is scheduled for an umbilical herniorrhaphy with mesh on 06/06/2018.  The risks and benefits of the procedure including bleeding, infection, mesh use, and the possibility of recurrence of the hernia were fully explained to the patient, who gave informed consent.  As the patient is having periumbilical pain, oxycodone has been prescribed.  He does  realize this may lower his seizure threshold.  He has taken this before.

## 2018-05-26 NOTE — H&P (Signed)
Matthew Nguyen; [id number]; September 17, 1970   HPI Patient is a 48 year old white male who was referred to my care by Dr. Yong Channel for evaluation treatment of an umbilical hernia.  Patient states he has had an umbilical hernia for many years.  Over the past year, he is noted decreasing in size of the hernia.  Is causing him discomfort especially when straining or coughing.  No nausea or vomiting have been noted.  He states his pain is 2 out of 10. Past Medical History:  Diagnosis Date  . Seizure [facility])     History reviewed. No pertinent surgical history.  History reviewed. No pertinent family history.  Current Outpatient Medications on File Prior to Visit  Medication Sig Dispense Refill  . amitriptyline (ELAVIL) 50 MG tablet Take 3 tablets every night 90 tablet 11  . naratriptan (AMERGE) 2.5 MG tablet Take 1 tablet at onset of headache. Do not take more than 2-3 a week 10 tablet 11  . zolpidem (AMBIEN) 5 MG tablet Take 5 mg by mouth at bedtime.    Marland Kitchen zonisamide (ZONEGRAN) 100 MG capsule Take 4 caps every night 120 capsule 11   No current facility-administered medications on file prior to visit.     Allergies  Allergen Reactions  . Morphine Nausea Only    Social History   Substance and Sexual Activity  Alcohol Use Yes    Social History   Tobacco Use  Smoking Status Current Every Day Smoker  Smokeless Tobacco Never Used    Review of Systems  Constitutional: Negative.   HENT: Negative.   Eyes: Negative.   Respiratory: Negative.   Cardiovascular: Negative.   Gastrointestinal: Positive for abdominal pain.  Genitourinary: Negative.   Musculoskeletal: Negative.   Skin: Negative.   Neurological: Positive for seizures.  Endo/Heme/Allergies: Negative.   Psychiatric/Behavioral: Negative.     Objective   Vitals:   05/26/18 1034  BP: (!) 133/100  Pulse: (!) 111  Resp: 18  Temp: 98.6 F (37 C)    Physical Exam Vitals signs reviewed.  Constitutional:    Appearance: Normal appearance. He is normal weight. He is not ill-appearing.  HENT:     Head: Normocephalic and atraumatic.  Cardiovascular:     Rate and Rhythm: Normal rate and regular rhythm.     Heart sounds: Normal heart sounds. No murmur. No friction rub. No gallop.   Pulmonary:     Effort: Pulmonary effort is normal. No respiratory distress.     Breath sounds: Normal breath sounds. No stridor. No wheezing, rhonchi or rales.  Abdominal:     General: Abdomen is flat. Bowel sounds are normal. There is no distension.     Palpations: Abdomen is soft. There is no mass.     Tenderness: There is no abdominal tenderness. There is no guarding or rebound.     Hernia: A hernia is present.     Comments: Reducible umbilical hernia present.  Skin:    General: Skin is warm and dry.  Neurological:     Mental Status: He is alert and oriented to person, place, and time.   Primary care notes reviewed  Assessment  Umbilical hernia Plan   Patient is scheduled for an umbilical herniorrhaphy with mesh on 06/06/2018.  The risks and benefits of the procedure including bleeding, infection, mesh use, and the possibility of recurrence of the hernia were fully explained to the patient, who gave informed consent.  As the patient is having periumbilical pain, oxycodone has been prescribed.  He does  realize this may lower his seizure threshold.  He has taken this before.

## 2018-06-01 ENCOUNTER — Encounter (HOSPITAL_COMMUNITY)
Admission: RE | Admit: 2018-06-01 | Discharge: 2018-06-01 | Disposition: A | Discharge status: Home / Self Care | Payer: Medicare HMO | Source: Ambulatory Visit | Attending: General Surgery | Admitting: General Surgery

## 2018-06-06 ENCOUNTER — Ambulatory Visit (HOSPITAL_COMMUNITY): Payer: Medicare HMO | Admitting: Anesthesiology

## 2018-06-06 ENCOUNTER — Ambulatory Visit (HOSPITAL_COMMUNITY)
Admission: RE | Admit: 2018-06-06 | Discharge: 2018-06-06 | Disposition: A | Discharge status: Home / Self Care | Payer: Medicare HMO | Attending: General Surgery | Admitting: General Surgery

## 2018-06-06 ENCOUNTER — Encounter (HOSPITAL_COMMUNITY): Payer: Self-pay | Admitting: Anesthesiology

## 2018-06-06 ENCOUNTER — Encounter (HOSPITAL_COMMUNITY)
Admission: RE | Disposition: A | Discharge status: Home / Self Care | Payer: Self-pay | Source: Home / Self Care | Attending: General Surgery

## 2018-06-06 DIAGNOSIS — F17200 Nicotine dependence, unspecified, uncomplicated: Secondary | ICD-10-CM | POA: Diagnosis not present

## 2018-06-06 DIAGNOSIS — R569 Unspecified convulsions: Secondary | ICD-10-CM | POA: Insufficient documentation

## 2018-06-06 DIAGNOSIS — R51 Headache: Secondary | ICD-10-CM | POA: Diagnosis not present

## 2018-06-06 DIAGNOSIS — K429 Umbilical hernia without obstruction or gangrene: Secondary | ICD-10-CM

## 2018-06-06 DIAGNOSIS — Z885 Allergy status to narcotic agent status: Secondary | ICD-10-CM | POA: Diagnosis not present

## 2018-06-06 DIAGNOSIS — Z79899 Other long term (current) drug therapy: Secondary | ICD-10-CM | POA: Diagnosis not present

## 2018-06-06 HISTORY — PX: UMBILICAL HERNIA REPAIR: SHX196

## 2018-06-06 SURGERY — REPAIR, HERNIA, UMBILICAL, ADULT
Anesthesia: General | Site: Abdomen

## 2018-06-06 MED ORDER — EPHEDRINE SULFATE 50 MG/ML IJ SOLN
INTRAMUSCULAR | Status: DC | PRN
  Administered 2018-06-06: 10 mg via INTRAVENOUS
  Administered 2018-06-06: 15 mg via INTRAVENOUS

## 2018-06-06 MED ORDER — CHLORHEXIDINE GLUCONATE CLOTH 2 % EX PADS: 6.0000 | MEDICATED_PAD | Freq: Once | CUTANEOUS | Status: DC

## 2018-06-06 MED ORDER — FENTANYL CITRATE (PF) 100 MCG/2ML IJ SOLN
INTRAMUSCULAR | Status: DC | PRN
  Administered 2018-06-06 (×3): 50 ug via INTRAVENOUS

## 2018-06-06 MED ORDER — KETOROLAC TROMETHAMINE 30 MG/ML IJ SOLN: 30.0000 mg | Freq: Once | INTRAMUSCULAR | Status: DC | PRN

## 2018-06-06 MED ORDER — PROPOFOL 10 MG/ML IV BOLUS
INTRAVENOUS | Status: AC
  Filled 2018-06-06: qty 20

## 2018-06-06 MED ORDER — LIDOCAINE HCL 1 % IJ SOLN
INTRAMUSCULAR | Status: DC | PRN
  Administered 2018-06-06: 60 mg via INTRADERMAL

## 2018-06-06 MED ORDER — HYDROCODONE-ACETAMINOPHEN 7.5-325 MG PO TABS
1.0000 | ORAL_TABLET | Freq: Once | ORAL | Status: AC | PRN
  Administered 2018-06-06: 1 via ORAL
  Filled 2018-06-06: qty 1

## 2018-06-06 MED ORDER — ONDANSETRON HCL 4 MG/2ML IJ SOLN
INTRAMUSCULAR | Status: AC
  Filled 2018-06-06: qty 2

## 2018-06-06 MED ORDER — MIDAZOLAM HCL 2 MG/2ML IJ SOLN
INTRAMUSCULAR | Status: AC
  Filled 2018-06-06: qty 2

## 2018-06-06 MED ORDER — MEPERIDINE HCL 50 MG/ML IJ SOLN: 6.2500 mg | INTRAMUSCULAR | Status: DC | PRN

## 2018-06-06 MED ORDER — POVIDONE-IODINE 10 % EX OINT
TOPICAL_OINTMENT | CUTANEOUS | Status: AC
  Filled 2018-06-06: qty 1

## 2018-06-06 MED ORDER — HYDROMORPHONE HCL 1 MG/ML IJ SOLN: 0.2500 mg | INTRAMUSCULAR | Status: DC | PRN

## 2018-06-06 MED ORDER — KETOROLAC TROMETHAMINE 30 MG/ML IJ SOLN
30.0000 mg | Freq: Once | INTRAMUSCULAR | Status: AC
  Administered 2018-06-06: 30 mg via INTRAVENOUS
  Filled 2018-06-06: qty 1

## 2018-06-06 MED ORDER — LACTATED RINGERS IV SOLN: INTRAVENOUS | Status: DC

## 2018-06-06 MED ORDER — ONDANSETRON HCL 4 MG/2ML IJ SOLN: 4.0000 mg | Freq: Once | INTRAMUSCULAR | Status: DC | PRN

## 2018-06-06 MED ORDER — ONDANSETRON HCL 4 MG/2ML IJ SOLN
INTRAMUSCULAR | Status: DC | PRN
  Administered 2018-06-06: 4 mg via INTRAVENOUS

## 2018-06-06 MED ORDER — PROPOFOL 10 MG/ML IV BOLUS
INTRAVENOUS | Status: DC | PRN
  Administered 2018-06-06: 200 mg via INTRAVENOUS

## 2018-06-06 MED ORDER — OXYCODONE-ACETAMINOPHEN 7.5-325 MG PO TABS: 1.0000 | ORAL_TABLET | Freq: Four times a day (QID) | ORAL | 0 refills | Status: DC | PRN

## 2018-06-06 MED ORDER — 0.9 % SODIUM CHLORIDE (POUR BTL) OPTIME
TOPICAL | Status: DC | PRN
  Administered 2018-06-06: 1000 mL

## 2018-06-06 MED ORDER — CEFAZOLIN SODIUM-DEXTROSE 2-4 GM/100ML-% IV SOLN
2.0000 g | INTRAVENOUS | Status: AC
  Administered 2018-06-06: 2 g via INTRAVENOUS
  Filled 2018-06-06: qty 100

## 2018-06-06 MED ORDER — LACTATED RINGERS IV SOLN
INTRAVENOUS | Status: DC | PRN
  Administered 2018-06-06: 08:00:00 via INTRAVENOUS

## 2018-06-06 MED ORDER — BUPIVACAINE LIPOSOME 1.3 % IJ SUSP
INTRAMUSCULAR | Status: AC
  Filled 2018-06-06: qty 20

## 2018-06-06 MED ORDER — MIDAZOLAM HCL 5 MG/5ML IJ SOLN
INTRAMUSCULAR | Status: DC | PRN
  Administered 2018-06-06 (×2): 2 mg via INTRAVENOUS

## 2018-06-06 MED ORDER — BUPIVACAINE LIPOSOME 1.3 % IJ SUSP
INTRAMUSCULAR | Status: DC | PRN
  Administered 2018-06-06: 20 mL

## 2018-06-06 MED ORDER — FENTANYL CITRATE (PF) 100 MCG/2ML IJ SOLN
INTRAMUSCULAR | Status: AC
  Filled 2018-06-06: qty 4

## 2018-06-06 MED ORDER — POVIDONE-IODINE 10 % OINT PACKET
TOPICAL_OINTMENT | CUTANEOUS | Status: DC | PRN
  Administered 2018-06-06: 1 via TOPICAL

## 2018-06-06 SURGICAL SUPPLY — 33 items
BLADE SURG SZ11 CARB STEEL (BLADE) ×3 IMPLANT
CHLORAPREP W/TINT 26ML (MISCELLANEOUS) ×3 IMPLANT
CLOTH BEACON ORANGE TIMEOUT ST (SAFETY) ×3 IMPLANT
COVER LIGHT HANDLE STERIS (MISCELLANEOUS) ×6 IMPLANT
COVER WAND RF STERILE (DRAPES) ×2 IMPLANT
ELECT REM PT RETURN 9FT ADLT (ELECTROSURGICAL) ×3
ELECTRODE REM PT RTRN 9FT ADLT (ELECTROSURGICAL) ×1 IMPLANT
GLOVE BIOGEL PI IND STRL 7.0 (GLOVE) ×2 IMPLANT
GLOVE BIOGEL PI INDICATOR 7.0 (GLOVE) ×4
GLOVE ECLIPSE 6.5 STRL STRAW (GLOVE) ×2 IMPLANT
GLOVE SURG SS PI 7.5 STRL IVOR (GLOVE) ×6 IMPLANT
GOWN STRL REUS W/TWL LRG LVL3 (GOWN DISPOSABLE) ×8 IMPLANT
INST SET MINOR GENERAL (KITS) ×3 IMPLANT
KIT TURNOVER KIT A (KITS) ×3 IMPLANT
LIGASURE IMPACT 36 18CM CVD LR (INSTRUMENTS) ×2 IMPLANT
MANIFOLD NEPTUNE II (INSTRUMENTS) ×3 IMPLANT
MESH VENTRALEX ST 1-7/10 CRC S (Mesh General) ×2 IMPLANT
NDL HYPO 21X1.5 SAFETY (NEEDLE) ×1 IMPLANT
NEEDLE HYPO 21X1.5 SAFETY (NEEDLE) ×3 IMPLANT
NS IRRIG 1000ML POUR BTL (IV SOLUTION) ×3 IMPLANT
PACK MINOR (CUSTOM PROCEDURE TRAY) ×3 IMPLANT
PAD ARMBOARD 7.5X6 YLW CONV (MISCELLANEOUS) ×3 IMPLANT
SET BASIN LINEN APH (SET/KITS/TRAYS/PACK) ×3 IMPLANT
SPONGE GAUZE 2X2 8PLY STER LF (GAUZE/BANDAGES/DRESSINGS) ×1
SPONGE GAUZE 2X2 8PLY STRL LF (GAUZE/BANDAGES/DRESSINGS) ×3 IMPLANT
STAPLER VISISTAT (STAPLE) ×3 IMPLANT
SUT ETHIBOND NAB MO 7 #0 18IN (SUTURE) ×3 IMPLANT
SUT VIC AB 2-0 CT2 27 (SUTURE) ×3 IMPLANT
SUT VIC AB 3-0 SH 27 (SUTURE) ×2
SUT VIC AB 3-0 SH 27X BRD (SUTURE) ×1 IMPLANT
SUT VICRYL AB 3 0 TIES (SUTURE) IMPLANT
SYR 20CC LL (SYRINGE) ×3 IMPLANT
TAPE PAPER 2X10 WHT MICROPORE (GAUZE/BANDAGES/DRESSINGS) ×2 IMPLANT

## 2018-06-06 NOTE — Discharge Instructions (Signed)
Open Hernia Repair, Adult, Care After This sheet gives you information about how to care for yourself after your procedure. Your health care provider may also give you more specific instructions. If you have problems or questions, contact your health care provider. What can I expect after the procedure? After the procedure, it is common to have:  Mild discomfort.  Slight bruising.  Minor swelling.  Pain in the abdomen. Follow these instructions at home: Incision care   Follow instructions from your health care provider about how to take care of your incision area. Make sure you: ? Wash your hands with soap and water before you change your bandage (dressing). If soap and water are not available, use hand sanitizer. ? Change your dressing as told by your health care provider. ? Leave stitches (sutures), skin glue, or adhesive strips in place. These skin closures may need to stay in place for 2 weeks or longer. If adhesive strip edges start to loosen and curl up, you may trim the loose edges. Do not remove adhesive strips completely unless your health care provider tells you to do that.  Check your incision area every day for signs of infection. Check for: ? More redness, swelling, or pain. ? More fluid or blood. ? Warmth. ? Pus or a bad smell. Activity  Do not drive or use heavy machinery while taking prescription pain medicine. Do not drive until your health care provider approves.  Until your health care provider approves: ? Do not lift anything that is heavier than 10 lb (4.5 kg). ? Do not play contact sports.  Return to your normal activities as told by your health care provider. Ask your health care provider what activities are safe. General instructions  To prevent or treat constipation while you are taking prescription pain medicine, your health care provider may recommend that you: ? Drink enough fluid to keep your urine clear or pale yellow. ? Take over-the-counter or  prescription medicines. ? Eat foods that are high in fiber, such as fresh fruits and vegetables, whole grains, and beans. ? Limit foods that are high in fat and processed sugars, such as fried and sweet foods.  Take over-the-counter and prescription medicines only as told by your health care provider.  Do not take tub baths or go swimming until your health care provider approves.  Keep all follow-up visits as told by your health care provider. This is important. Contact a health care provider if:  You develop a rash.  You have more redness, swelling, or pain around your incision.  You have more fluid or blood coming from your incision.  Your incision feels warm to the touch.  You have pus or a bad smell coming from your incision.  You have a fever or chills.  You have blood in your stool (feces).  You have not had a bowel movement in 2-3 days.  Your pain is not controlled with medicine. Get help right away if:  You have chest pain or shortness of breath.  You feel light-headed or feel faint.  You have severe pain.  You vomit and your pain is worse. This information is not intended to replace advice given to you by your health care provider. Make sure you discuss any questions you have with your health care provider. Document Released: 10/17/2004 Document Revised: 10/18/2015 Document Reviewed: 09/11/2015 Elsevier Interactive Patient Education  2019 [employer] Ventral Hernia  A ventral hernia is a bulge of tissue from inside the abdomen that pushes through  a weak area of the muscles that form the front wall of the abdomen. The tissues inside the abdomen are inside a sac (peritoneum). These tissues include the small intestine, large intestine, and the fatty tissue that covers the intestines (omentum). Sometimes, the bulge that forms a hernia contains intestines. Other hernias contain only fat. Ventral hernias do not go away without surgical treatment. There are several  types of ventral hernias. You may have:  A hernia at an incision site from previous abdominal surgery (incisional hernia).  A hernia just above the belly button (epigastric hernia), or at the belly button (umbilical hernia). These types of hernias can develop from heavy lifting or straining.  A hernia that comes and goes (reducible hernia). It may be visible only when you lift or strain. This type of hernia can be pushed back into the abdomen (reduced).  A hernia that traps abdominal tissue inside the hernia (incarcerated hernia). This type of hernia does not reduce.  A hernia that cuts off blood flow to the tissues inside the hernia (strangulated hernia). The tissues can start to die if this happens. This is a very painful bulge that cannot be reduced. A strangulated hernia is a medical emergency. What are the causes? This condition is caused by abdominal tissue putting pressure on an area of weakness in the abdominal muscles. What increases the risk? The following factors may make you more likely to develop this condition:  Being male.  Being 23 or older.  Being overweight or obese.  Having had previous abdominal surgery, especially if there was an infection after surgery.  Having had an injury to the abdominal wall.  Having had several pregnancies.  Having a buildup of fluid inside the abdomen (ascites). What are the signs or symptoms? The only symptom of a ventral hernia may be a painless bulge in the abdomen. A reducible hernia may be visible only when you strain, cough, or lift. Other symptoms may include:  Dull pain.  A feeling of pressure. Signs and symptoms of a strangulated hernia may include:  Increasing pain.  Nausea and vomiting.  Pain when pressing on the hernia.  The skin over the hernia turning red or purple.  Constipation.  Blood in the stool (feces). How is this diagnosed? This condition may be diagnosed based on:  Your symptoms.  Your medical  history.  A physical exam. You may be asked to cough or strain while standing. These actions increase the pressure inside your abdomen and force the hernia through the opening in your muscles. Your health care provider may try to reduce the hernia by pressing on it.  Imaging studies, such as an ultrasound or CT scan. How is this treated? This condition is treated with surgery. If you have a strangulated hernia, surgery is done as soon as possible. If your hernia is small and not incarcerated, you may be asked to lose some weight before surgery. Follow these instructions at home:  Follow instructions from your health care provider about eating or drinking restrictions.  If you are overweight, your health care provider may recommend that you increase your activity level and eat a healthier diet.  Do not lift anything that is heavier than 10 lb (4.5 kg).  Return to your normal activities as told by your health care provider. Ask your health care provider what activities are safe for you. You may need to avoid activities that increase pressure on your hernia.  Take over-the-counter and prescription medicines only as told by your  health care provider.  Keep all follow-up visits as told by your health care provider. This is important. Contact a health care provider if:  Your hernia gets larger.  Your hernia becomes painful. Get help right away if:  Your hernia becomes increasingly painful.  You have pain along with any of the following: ? Changes in skin color in the area of the hernia. ? Nausea. ? Vomiting. ? Fever. Summary  A ventral hernia is a bulge of tissue from inside the abdomen that pushes through a weak area of the muscles that form the front wall of the abdomen.  This condition is treated with surgery, which may be urgent depending on your hernia.  Do not lift anything that is heavier than 10 lb (4.5 kg), and follow activity instructions from your health care  provider. This information is not intended to replace advice given to you by your health care provider. Make sure you discuss any questions you have with your health care provider. Document Released: 03/16/2012 Document Revised: 05/12/2017 Document Reviewed: 10/19/2016 Elsevier Interactive Patient Education  2019 [place] Anesthesia, Adult, Care After This sheet gives you information about how to care for yourself after your procedure. Your health care provider may also give you more specific instructions. If you have problems or questions, contact your health care provider. What can I expect after the procedure? After the procedure, the following side effects are common:  Pain or discomfort at the IV site.  Nausea.  Vomiting.  Sore throat.  Trouble concentrating.  Feeling cold or chills.  Weak or tired.  Sleepiness and fatigue.  Soreness and body aches. These side effects can affect parts of the body that were not involved in surgery. Follow these instructions at home:  For at least 24 hours after the procedure:  Have a responsible adult stay with you. It is important to have someone help care for you until you are awake and alert.  Rest as needed.  Do not: ? Participate in activities in which you could fall or become injured. ? Drive. ? Use heavy machinery. ? Drink alcohol. ? Take sleeping pills or medicines that cause drowsiness. ? Make important decisions or sign legal documents. ? Take care of children on your own. Eating and drinking  Follow any instructions from your health care provider about eating or drinking restrictions.  When you feel hungry, start by eating small amounts of foods that are soft and easy to digest (bland), such as toast. Gradually return to your regular diet.  Drink enough fluid to keep your urine pale yellow.  If you vomit, rehydrate by drinking water, juice, or clear broth. General instructions  If you have  sleep apnea, surgery and certain medicines can increase your risk for breathing problems. Follow instructions from your health care provider about wearing your sleep device: ? Anytime you are sleeping, including during daytime naps. ? While taking prescription pain medicines, sleeping medicines, or medicines that make you drowsy.  Return to your normal activities as told by your health care provider. Ask your health care provider what activities are safe for you.  Take over-the-counter and prescription medicines only as told by your health care provider.  If you smoke, do not smoke without supervision.  Keep all follow-up visits as told by your health care provider. This is important. Contact a health care provider if:  You have nausea or vomiting that does not get better with medicine.  You cannot eat or  drink without vomiting.  You have pain that does not get better with medicine.  You are unable to pass urine.  You develop a skin rash.  You have a fever.  You have redness around your IV site that gets worse. Get help right away if:  You have difficulty breathing.  You have chest pain.  You have blood in your urine or stool, or you vomit blood. Summary  After the procedure, it is common to have a sore throat or nausea. It is also common to feel tired.  Have a responsible adult stay with you for the first 24 hours after general anesthesia. It is important to have someone help care for you until you are awake and alert.  When you feel hungry, start by eating small amounts of foods that are soft and easy to digest (bland), such as toast. Gradually return to your regular diet.  Drink enough fluid to keep your urine pale yellow.  Return to your normal activities as told by your health care provider. Ask your health care provider what activities are safe for you. This information is not intended to replace advice given to you by your health care provider. Make sure you discuss  any questions you have with your health care provider. Document Released: 07/06/2000 Document Revised: 11/13/2016 Document Reviewed: 11/13/2016 Elsevier Interactive Patient Education  2019 [employer].

## 2018-06-06 NOTE — Anesthesia Preprocedure Evaluation (Signed)
Anesthesia Evaluation  Patient identified by MRN, date of birth, ID band Patient awake    Reviewed: Allergy & Precautions, H&P , NPO status , Patient's Chart, lab work & pertinent test results  Airway Mallampati: II  TM Distance: >3 FB Neck ROM: full    Dental no notable dental hx.    Pulmonary neg pulmonary ROS, Current Smoker,    Pulmonary exam normal breath sounds clear to auscultation       Cardiovascular Exercise Tolerance: Good negative cardio ROS   Rhythm:regular Rate:Normal     Neuro/Psych  Headaches, Seizures -,  Q2 wk szs negative psych ROS   GI/Hepatic negative GI ROS, Neg liver ROS,   Endo/Other  negative endocrine ROS  Renal/GU negative Renal ROS  negative genitourinary   Musculoskeletal   Abdominal   Peds  Hematology negative hematology ROS (+)   Anesthesia Other Findings   Reproductive/Obstetrics negative OB ROS                             Anesthesia Physical Anesthesia Plan  ASA: III  Anesthesia Plan: General   Post-op Pain Management:    Induction:   PONV Risk Score and Plan:   Airway Management Planned:   Additional Equipment:   Intra-op Plan:   Post-operative Plan:   Informed Consent: I have reviewed the patients History and Physical, chart, labs and discussed the procedure including the risks, benefits and alternatives for the proposed anesthesia with the patient or authorized representative who has indicated his/her understanding and acceptance.     Dental Advisory Given  Plan Discussed with: CRNA  Anesthesia Plan Comments:         Anesthesia Quick Evaluation

## 2018-06-06 NOTE — Op Note (Signed)
Patient:  Matthew Nguyen  DOB:  1970/06/18  MRN:  [id number]   Preop Diagnosis: Umbilical hernia  Postop Diagnosis: Same  Procedure: Umbilical herniorrhaphy with mesh  Surgeon: Aviva Signs, MD  Anes: General   Indications: Patient is a 48 year old white male who presents with a symptomatic umbilical hernia.  The risks and benefits of the procedure including bleeding, infection, mesh use, and the possibility of recurrence of the hernia were fully explained to the patient, who gave informed consent.  Procedure note: The patient was placed in supine position.  After general anesthesia was administered, the abdomen was prepped and draped using usual sterile technique with ChloraPrep.  Surgical site confirmation was performed.  An infraumbilical incision was made down to the fascia.  The umbilicus was freed away from the hernia sac.  The hernia sac was entered into and omentum as well as part of the falciform ligament was noted to be within the hernia sac.  Both were resected using the LigaSure and disposed of.  The resultant defect was approximately 1-1/2 cm in its greatest diameter.  I palpated the underside of the abdominal wall and no other hernia defects were identified.  A Bard 4.3 cm Ventralax ST patch was then inserted and secured to the fascia using 0 Ethibond interrupted sutures.  The overlying fascia was reapproximated transversely using 0 Ethibond interrupted sutures.  The base of the umbilicus was secured back to the fascia using a 2-0 Vicryl interrupted suture.  Subcutaneous layer was reapproximated using a 3-0 Vicryl interrupted suture.  Exparel was instilled into the surrounding wound.  The skin was closed using staples.  Betadine ointment and dry sterile dressings were applied.  All tape and needle counts were correct at the end of the procedure.  The patient was awakened and transferred to PACU in stable condition.  Complications: None  EBL: Minimal  Specimen:  None

## 2018-06-06 NOTE — Anesthesia Procedure Notes (Signed)
Procedure Name: LMA Insertion Date/Time: 06/06/2018 8:40 AM Performed by: Charmaine Downs, CRNA Pre-anesthesia Checklist: Patient identified, Patient being monitored, Emergency Drugs available, Timeout performed and Suction available Patient Re-evaluated:Patient Re-evaluated prior to induction Oxygen Delivery Method: Circle System Utilized Preoxygenation: Pre-oxygenation with 100% oxygen Induction Type: IV induction Ventilation: Mask ventilation without difficulty LMA: LMA inserted LMA Size: 4.0 Number of attempts: 1 Placement Confirmation: positive ETCO2 and breath sounds checked- equal and bilateral Tube secured with: Tape Dental Injury: Teeth and Oropharynx as per pre-operative assessment

## 2018-06-06 NOTE — Transfer of Care (Signed)
Immediate Anesthesia Transfer of Care Note  Patient: Matthew Nguyen  Procedure(s) Performed: HERNIA REPAIR UMBILICAL ADULT WITH MESH (N/A Abdomen)  Patient Location: PACU  Anesthesia Type:General  Level of Consciousness: awake and patient cooperative  Airway & Oxygen Therapy: Patient Spontanous Breathing and Patient connected to nasal cannula oxygen  Post-op Assessment: Report given to RN, Post -op Vital signs reviewed and stable and Patient moving all extremities  Post vital signs: Reviewed and stable  Last Vitals:  Vitals Value Taken Time  BP    Temp    Pulse    Resp    SpO2      Last Pain:  Vitals:   06/06/18 0740  PainSc: 5          Complications: No apparent anesthesia complications

## 2018-06-06 NOTE — Interval H&P Note (Signed)
History and Physical Interval Note:  06/06/2018 7:56 AM  Matthew Nguyen  has presented today for surgery, with the diagnosis of umbilical hernia  The various methods of treatment have been discussed with the patient and family. After consideration of risks, benefits and other options for treatment, the patient has consented to  Procedure(s): HERNIA REPAIR UMBILICAL ADULT WITH MESH (N/A) as a surgical intervention .  The patient's history has been reviewed, patient examined, no change in status, stable for surgery.  I have reviewed the patient's chart and labs.  Questions were answered to the patient's satisfaction.     Aviva Signs

## 2018-06-06 NOTE — Anesthesia Postprocedure Evaluation (Signed)
Anesthesia Post Note  Patient: Matthew Nguyen  Procedure(s) Performed: HERNIA REPAIR UMBILICAL ADULT WITH MESH (N/A Abdomen)  Patient location during evaluation: PACU Anesthesia Type: General Level of consciousness: awake and alert and patient cooperative Pain management: pain level controlled Vital Signs Assessment: post-procedure vital signs reviewed and stable Respiratory status: spontaneous breathing, nonlabored ventilation and respiratory function stable Cardiovascular status: blood pressure returned to baseline Postop Assessment: no apparent nausea or vomiting Anesthetic complications: no     Last Vitals:  Vitals:   06/06/18 0930 06/06/18 0940  BP: (!) 123/96   Pulse: (!) 106 99  Resp: 18 20  Temp: 36.6 C   SpO2: 95% 94%    Last Pain:  Vitals:   06/06/18 0930  PainSc: 0-No pain                 Kyren Knick J

## 2018-06-07 ENCOUNTER — Encounter (HOSPITAL_COMMUNITY): Payer: Self-pay | Admitting: General Surgery

## 2018-06-14 ENCOUNTER — Ambulatory Visit (INDEPENDENT_AMBULATORY_CARE_PROVIDER_SITE_OTHER): Payer: Self-pay | Admitting: General Surgery

## 2018-06-14 ENCOUNTER — Encounter: Payer: Self-pay | Admitting: General Surgery

## 2018-06-14 VITALS — BP 134/90 | HR 86 | Temp 98.0°F | Resp 20 | Wt 160.2 lb

## 2018-06-14 DIAGNOSIS — Z09 Encounter for follow-up examination after completed treatment for conditions other than malignant neoplasm: Secondary | ICD-10-CM

## 2018-06-14 MED ORDER — OXYCODONE-ACETAMINOPHEN 7.5-325 MG PO TABS: 1.0000 | ORAL_TABLET | Freq: Four times a day (QID) | ORAL | 0 refills | Status: DC | PRN

## 2018-06-14 NOTE — Progress Notes (Signed)
Subjective:     Matthew Nguyen  Here for postoperative visit.  Mild incisional pain is noted.  He otherwise is pleased with the surgery. Objective:    BP 134/90 (BP Location: Left Arm, Patient Position: Sitting, Cuff Size: Normal)   Pulse 86   Temp 98 F (36.7 C) (Temporal)   Resp 20   Wt 160 lb 3.2 oz (72.7 kg)   BMI 23.66 kg/m   General:  alert, cooperative and no distress  Abdomen soft, incision healing well.  Staples removed.     Assessment:    Doing well postoperatively.    Plan:   I did reorder his Percocet for pain.  He should avoid heavy lifting for the next several weeks.  He should keep his back straight and use his thighs when lifting something.  Follow-up here as needed.

## 2018-08-25 ENCOUNTER — Other Ambulatory Visit: Payer: Self-pay

## 2018-08-25 MED ORDER — AMITRIPTYLINE HCL 50 MG PO TABS: 150.0000 mg | ORAL_TABLET | Freq: Every day | ORAL | 3 refills | Status: DC

## 2018-08-25 MED ORDER — SUMATRIPTAN SUCCINATE 50 MG PO TABS: 50.0000 mg | ORAL_TABLET | ORAL | 1 refills | Status: DC | PRN

## 2018-08-25 MED ORDER — ZONISAMIDE 100 MG PO CAPS: 400.0000 mg | ORAL_CAPSULE | Freq: Every day | ORAL | 3 refills | Status: DC

## 2018-10-24 ENCOUNTER — Other Ambulatory Visit: Payer: Self-pay

## 2018-10-24 ENCOUNTER — Emergency Department (HOSPITAL_COMMUNITY)
Admission: EM | Admit: 2018-10-24 | Discharge: 2018-10-24 | Disposition: A | Discharge status: Home / Self Care | Payer: Medicare HMO | Attending: Emergency Medicine | Admitting: Emergency Medicine

## 2018-10-24 ENCOUNTER — Emergency Department (HOSPITAL_COMMUNITY): Payer: Medicare HMO

## 2018-10-24 ENCOUNTER — Telehealth: Payer: Self-pay | Admitting: Neurology

## 2018-10-24 ENCOUNTER — Encounter (HOSPITAL_COMMUNITY): Payer: Self-pay | Admitting: Emergency Medicine

## 2018-10-24 DIAGNOSIS — Z79899 Other long term (current) drug therapy: Secondary | ICD-10-CM | POA: Insufficient documentation

## 2018-10-24 DIAGNOSIS — F17210 Nicotine dependence, cigarettes, uncomplicated: Secondary | ICD-10-CM | POA: Insufficient documentation

## 2018-10-24 DIAGNOSIS — R296 Repeated falls: Secondary | ICD-10-CM | POA: Diagnosis not present

## 2018-10-24 DIAGNOSIS — R410 Disorientation, unspecified: Secondary | ICD-10-CM | POA: Diagnosis present

## 2018-10-24 LAB — CBC WITH DIFFERENTIAL/PLATELET
Abs Immature Granulocytes: 0.03 10*3/uL (ref 0.00–0.07)
Basophils Absolute: 0 10*3/uL (ref 0.0–0.1)
Basophils Relative: 1 %
Eosinophils Absolute: 0.2 10*3/uL (ref 0.0–0.5)
Eosinophils Relative: 3 %
HCT: 44.1 % (ref 39.0–52.0)
Hemoglobin: 14.6 g/dL (ref 13.0–17.0)
Immature Granulocytes: 0 %
Lymphocytes Relative: 21 %
Lymphs Abs: 1.4 10*3/uL (ref 0.7–4.0)
MCH: 32.2 pg (ref 26.0–34.0)
MCHC: 33.1 g/dL (ref 30.0–36.0)
MCV: 97.4 fL (ref 80.0–100.0)
Monocytes Absolute: 0.9 10*3/uL (ref 0.1–1.0)
Monocytes Relative: 13 %
Neutro Abs: 4.4 10*3/uL (ref 1.7–7.7)
Neutrophils Relative %: 62 %
Platelets: 352 10*3/uL (ref 150–400)
RBC: 4.53 MIL/uL (ref 4.22–5.81)
RDW: 13.3 % (ref 11.5–15.5)
WBC: 6.9 10*3/uL (ref 4.0–10.5)
nRBC: 0 % (ref 0.0–0.2)

## 2018-10-24 LAB — COMPREHENSIVE METABOLIC PANEL
ALT: 15 U/L (ref 0–44)
AST: 27 U/L (ref 15–41)
Albumin: 3.7 g/dL (ref 3.5–5.0)
Alkaline Phosphatase: 86 U/L (ref 38–126)
Anion gap: 14 (ref 5–15)
BUN: 10 mg/dL (ref 6–20)
CO2: 21 mmol/L — ABNORMAL LOW (ref 22–32)
Calcium: 8.8 mg/dL — ABNORMAL LOW (ref 8.9–10.3)
Chloride: 98 mmol/L (ref 98–111)
Creatinine, Ser: 1.04 mg/dL (ref 0.61–1.24)
GFR calc Af Amer: >60 mL/min (ref >60)
GFR calc non Af Amer: >60 mL/min (ref >60)
Glucose, Bld: 87 mg/dL (ref 70–99)
Potassium: 3.8 mmol/L (ref 3.5–5.1)
Sodium: 133 mmol/L — ABNORMAL LOW (ref 135–145)
Total Bilirubin: 0.7 mg/dL (ref 0.3–1.2)
Total Protein: 7 g/dL (ref 6.5–8.1)

## 2018-10-24 LAB — URINALYSIS, ROUTINE W REFLEX MICROSCOPIC
Bilirubin Urine: NEGATIVE
Glucose, UA: NEGATIVE mg/dL
Hgb urine dipstick: NEGATIVE
Ketones, ur: NEGATIVE mg/dL
Leukocytes,Ua: NEGATIVE
Nitrite: NEGATIVE
Protein, ur: NEGATIVE mg/dL
Specific Gravity, Urine: 1.005 (ref 1.005–1.030)
pH: 7 (ref 5.0–8.0)

## 2018-10-24 LAB — CBG MONITORING, ED: Glucose-Capillary: 83 mg/dL (ref 70–99)

## 2018-10-24 LAB — LACTIC ACID, PLASMA: Lactic Acid, Venous: 0.9 mmol/L (ref 0.5–1.9)

## 2018-10-24 LAB — RAPID URINE DRUG SCREEN, HOSP PERFORMED
Amphetamines: NOT DETECTED
Barbiturates: NOT DETECTED
Benzodiazepines: POSITIVE — AB
Cocaine: NOT DETECTED
Opiates: NOT DETECTED
Tetrahydrocannabinol: POSITIVE — AB

## 2018-10-24 LAB — VALPROIC ACID LEVEL: Valproic Acid Lvl: 50 ug/mL (ref 50.0–100.0)

## 2018-10-24 LAB — ETHANOL: Alcohol, Ethyl (B): <10 mg/dL (ref <10)

## 2018-10-24 LAB — AMMONIA: Ammonia: 10 umol/L (ref 9–35)

## 2018-10-24 MED ORDER — SODIUM CHLORIDE 0.9 % IV BOLUS
500.0000 mL | Freq: Once | INTRAVENOUS | Status: AC
  Administered 2018-10-24: 500 mL via INTRAVENOUS

## 2018-10-24 MED ORDER — LIDOCAINE VISCOUS HCL 2 % MT SOLN
15.0000 mL | Freq: Once | OROMUCOSAL | Status: AC
  Administered 2018-10-24: 15 mL via ORAL
  Filled 2018-10-24: qty 15

## 2018-10-24 MED ORDER — ALUM & MAG HYDROXIDE-SIMETH 200-200-20 MG/5ML PO SUSP
30.0000 mL | Freq: Once | ORAL | Status: AC
  Administered 2018-10-24: 30 mL via ORAL
  Filled 2018-10-24: qty 30

## 2018-10-24 NOTE — ED Notes (Signed)
DaughterKathlee [phone]

## 2018-10-24 NOTE — Discharge Instructions (Addendum)
Follow-up with your neurologist as scheduled.  Return to ER for worsening or concerning symptoms. Confusion may be caused by the Ultram, Librium, or Ambien or a combination of these medications.

## 2018-10-24 NOTE — ED Notes (Signed)
Patient transported to CT 

## 2018-10-24 NOTE — ED Provider Notes (Signed)
[facility] Provider Note   CSN: [id number] Arrival date & time: 10/24/18  6237    History   Chief Complaint Chief Complaint  Patient presents with  . Altered Mental Status    HPI Matthew Nguyen is a 48 y.o. male.     49yo male with history of headaches and seizures, brought in by his sister for confusion and falls. Patient is a poor historian, states he has felt like his head is in outer space for the past several weeks, he's had 20+ seizures in the past 2 weeks, was recently admitted to [facility] for abdominal pain (states this was yesterday but this is incorrect). History provided by patient's sister at bedside- patient has been slightly confused for several weeks, noticeably more confused for the past week. Patient was admitted to [facility] last week Wednesday-Thursday for confusion and multiple falls with abdominal pain, found to be dehydrated with lower intestine inflammation. Patient has been living at his house, a friend has moved in this week to help due to family/friend concerns for patient's health. Last fall was yesterday, unclear events around the fall. Patient denies SI/HI, states his family took the keys to his gun safe, sister states this is due to his confused state, no particular dangerous events.      Past Medical History:  Diagnosis Date  . Seizure [facility])     Patient Active Problem List   Diagnosis Date Noted  . Umbilical hernia without obstruction and without gangrene   . Localization-related idiopathic epilepsy and epileptic syndromes with seizures of localized onset, not intractable, without status epilepticus ([place]) 10/27/2017  . Chronic daily headache 10/27/2017    Past Surgical History:  Procedure Laterality Date  . UMBILICAL HERNIA REPAIR N/A 06/06/2018   Procedure: HERNIA REPAIR UMBILICAL ADULT WITH MESH;  Surgeon: Aviva Signs, MD;  Location: [facility];  Service: General;  Laterality: N/A;         Home Medications    Prior to Admission medications   Medication Sig Start Date End Date Taking? Authorizing Provider  amitriptyline (ELAVIL) 50 MG tablet Take 3 tablets (150 mg total) by mouth at bedtime. 08/25/18  Yes Cameron Sprang, MD  chlordiazePOXIDE (LIBRIUM) 25 MG capsule Take 25 mg by mouth See admin instructions. Take 50 mg by mouth every 6 hours for 1 day Take 25 mg every 6 hours for 2 days then stop   Yes [provider]  divalproex (DEPAKOTE) 500 MG DR tablet Take 500 mg by mouth 2 (two) times daily.   Yes [provider]  pantoprazole (PROTONIX) 40 MG tablet Take 40 mg by mouth daily.   Yes [provider]  zolpidem (AMBIEN) 5 MG tablet Take 5 mg by mouth at bedtime. 08/12/15 10/24/18 Yes [provider]  oxyCODONE-acetaminophen (PERCOCET) 7.5-325 MG tablet Take 1 tablet by mouth every 6 (six) hours as needed. Patient not taking: Reported on 10/24/2018 06/14/18 06/14/19  Aviva Signs, MD  SUMAtriptan (IMITREX) 50 MG tablet Take 1 tablet (50 mg total) by mouth every 2 (two) hours as needed for migraine or headache. May repeat in 2 hours if headache persists or recurs. Patient not taking: Reported on 10/24/2018 08/25/18   Cameron Sprang, MD  zonisamide (ZONEGRAN) 100 MG capsule Take 4 capsules (400 mg total) by mouth at bedtime. Patient not taking: Reported on 10/24/2018 08/25/18   Cameron Sprang, MD    Family History History reviewed. No pertinent family history.  Social History  Social History   Tobacco Use  . Smoking status: Current Every Day Smoker  . Smokeless tobacco: Never Used  Substance Use Topics  . Alcohol use: Yes  . Drug use: No     Allergies   Morphine   Review of Systems Review of Systems  Unable to perform ROS: Mental status change     Physical Exam Updated Vital Signs BP (!) 133/105   Pulse 96   Temp 98.2 F (36.8 C) (Oral)   Resp 16   SpO2 98%   Physical Exam Vitals signs and nursing note reviewed.   Constitutional:      General: He is not in acute distress.    Appearance: He is well-developed. He is not diaphoretic.  HENT:     Head: Normocephalic and atraumatic.     Mouth/Throat:     Mouth: Mucous membranes are dry.  Eyes:     Extraocular Movements: Extraocular movements intact.     Pupils: Pupils are equal, round, and reactive to light.  Cardiovascular:     Rate and Rhythm: Regular rhythm. Tachycardia present.     Pulses: Normal pulses.     Heart sounds: Normal heart sounds.  Pulmonary:     Effort: Pulmonary effort is normal.     Breath sounds: Normal breath sounds.  Abdominal:     General: There is no distension.     Tenderness: There is no abdominal tenderness.  Skin:    General: Skin is warm and dry.     Findings: No erythema or rash.  Neurological:     Mental Status: He is alert.     GCS: GCS eye subscore is 4. GCS verbal subscore is 4. GCS motor subscore is 6.     Cranial Nerves: No cranial nerve deficit.     Sensory: Sensation is intact. No sensory deficit.     Motor: No weakness or pronator drift.     Coordination: Coordination normal. Rapid alternating movements normal.     Comments: Alert to person and birth date. Does not know recent events, date/month/year  Psychiatric:        Behavior: Behavior normal.      ED Treatments / Results  Labs (all labs ordered are listed, but only abnormal results are displayed) Labs Reviewed  COMPREHENSIVE METABOLIC PANEL - Abnormal; Notable for the following components:      Result Value   Sodium 133 (*)    CO2 21 (*)    Calcium 8.8 (*)    All other components within normal limits  URINALYSIS, ROUTINE W REFLEX MICROSCOPIC - Abnormal; Notable for the following components:   Color, Urine STRAW (*)    All other components within normal limits  RAPID URINE DRUG SCREEN, HOSP PERFORMED - Abnormal; Notable for the following components:   Benzodiazepines POSITIVE (*)    Tetrahydrocannabinol POSITIVE (*)    All other  components within normal limits  CBC WITH DIFFERENTIAL/PLATELET  LACTIC ACID, PLASMA  AMMONIA  ETHANOL  VALPROIC ACID LEVEL  CBG MONITORING, ED    EKG EKG Interpretation  Date/Time:  Monday October 24 2018 08:36:10 EDT Ventricular Rate:  109 PR Interval:    QRS Duration: 91 QT Interval:  336 QTC Calculation: 453 R Axis:   77 Text Interpretation:  Sinus tachycardia Probable anteroseptal infarct, old Since last tracing rate faster Confirmed by Isla Pence [phone]) on 10/24/2018 8:54:52 AM   Radiology Ct Head Wo Contrast  Result Date: 10/24/2018 CLINICAL DATA:  Confusion.  History of seizures EXAM: CT  HEAD WITHOUT CONTRAST TECHNIQUE: Contiguous axial images were obtained from the base of the skull through the vertex without intravenous contrast. COMPARISON:  October 05, 2010 FINDINGS: Brain: The ventricles are normal in size and configuration. There is no intracranial mass, hemorrhage, extra-axial fluid collection, or midline shift. Brain parenchyma appears unremarkable. No acute infarct is evident. Vascular: There is hyperdense vessel. There are foci of calcification in the distal vertebral arteries. Skull: The bony calvarium appears intact. Sinuses/Orbits: There is mucosal thickening in several ethmoid air cells. Other visualized paranasal sinuses are clear. Orbits appear symmetric bilaterally. Other: Mastoid air cells are clear. IMPRESSION: Brain parenchyma appears unremarkable. No acute infarct evident. No mass or hemorrhage. There are foci of vertebral artery calcification. There is mucosal thickening in several ethmoid air cells. Electronically Signed   By: Lowella Grip III M.D.   On: 10/24/2018 09:21   Dg Chest Port 1 View  Result Date: 10/24/2018 CLINICAL DATA:  Lateral chest pain, productive cough. EXAM: PORTABLE CHEST 1 VIEW COMPARISON:  September 18, 2013 FINDINGS: Cardiomediastinal silhouette is normal. Mediastinal contours appear intact. There is no evidence of focal airspace  consolidation, pleural effusion or pneumothorax. Right lower lobe atelectasis. Osseous structures are without acute abnormality. Soft tissues are grossly normal. IMPRESSION: Right lower lobe atelectasis. Electronically Signed   By: Fidela Salisbury M.D.   On: 10/24/2018 09:13    Procedures Procedures (including critical care time)  Medications Ordered in ED Medications  sodium chloride 0.9 % bolus 500 mL (500 mLs Intravenous New Bag/Given 10/24/18 0936)  alum & mag hydroxide-simeth (MAALOX/MYLANTA) 200-200-20 MG/5ML suspension 30 mL (30 mLs Oral Given 10/24/18 1146)    And  lidocaine (XYLOCAINE) 2 % viscous mouth solution 15 mL (15 mLs Oral Given 10/24/18 1146)     Initial Impression / Assessment and Plan / ED Course  I have reviewed the triage vital signs and the nursing notes.  Pertinent labs & imaging results that were available during my care of the patient were reviewed by me and considered in my medical decision making (see chart for details).  Clinical Course as of Oct 23 1204  Mon Oct 24, 2018  1043 Review of records from admission to [facility] from July 8 to October 20, 2018.  Patient was brought to the hospital for abdominal pain by his sister, as she was walking him out to the car he had a seizure.  Patient was Keppra loaded at the hospital, found to have gastritis and duodenitis which was thought to be related to alcohol abuse and was treated with IV Protonix and Carafate with plan for follow-up with GI.  Patient was started on Depakote for his report of frequent seizures, also given a Librium taper for alcohol withdrawal.  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   [LM]  [age]yo male with history of seizures, brought in by sister for confusion, worse for the past week. Patient is a difficult historian, history provided by sister and medical records from recent admission. Patient is able to follow commands, gives verbal responses that are  appropriate but maybe wrong in context or by dates. Neuro exam is otherwise normal. Labs including CBC, CMP, etoh, amonia, valproic acid, UA are without significant findings. Patient admits to daily alcohol use. UDS is positive for benzos and marijuana, patient is prescribed Librium from recent hospitalization for withdrawal which he has been taking (disp #16, #8 left in bottle). Patient takes ambien nightly, last filled 09/15/2018, the bottle is empty  and per PDMP has not been recently filled. Patient was prescribed Ultram 10/20/2018, #10 tablets. CT head and CXR without significant findings. Case discussed with Dr. Gilford Raid, ER attending, consider poly pharmacy as possible cause of patient's confusion. Recommend patient follow up with his neurologist, is scheduled for a virtual neuro visit tomorrow.   [LM]    Clinical Course User Index [LM] Tacy Learn, PA-C      Final Clinical Impressions(s) / ED Diagnoses   Final diagnoses:  Confusion    ED Discharge Orders    None       Roque Lias 10/24/18 1207    Isla Pence, MD 10/24/18 1240

## 2018-10-24 NOTE — ED Notes (Signed)
Culture sent in addition to UA 

## 2018-10-24 NOTE — ED Triage Notes (Signed)
Pt voiced  Name, DOB, unable to tell me the president, or the year. Daughter stated, he lives alone. For the last 2 weeks, its like he's been drunk.

## 2018-10-24 NOTE — Telephone Encounter (Signed)
New Message  Patient's sister verbalized patient is having severe short term memory loss and confusion. ED MD not sure if patient was having another seizure. Not sure if medication is causing patient to have short term memory loss.

## 2018-10-24 NOTE — ED Triage Notes (Signed)
Pt coming from home today. Pt states that friends came to check on him this morning and was found on the floor. Pt states that he has been having trouble remembering things over the last few days. Pt states he has had 30 seizures over the last two weeks. Was seen in [place] and released yesterday.

## 2018-10-24 NOTE — ED Notes (Signed)
Patient Alert and oriented to baseline. Stable and ambulatory to baseline. Patient verbalized understanding of the discharge instructions.  Patient belongings were taken by the patient.   

## 2018-10-24 NOTE — ED Notes (Signed)
Pt ambulated in hallway. Smooth and steady gait.

## 2018-10-25 ENCOUNTER — Encounter: Payer: Self-pay | Admitting: Neurology

## 2018-10-25 ENCOUNTER — Other Ambulatory Visit: Payer: Self-pay

## 2018-10-25 ENCOUNTER — Telehealth (INDEPENDENT_AMBULATORY_CARE_PROVIDER_SITE_OTHER): Payer: Medicare HMO | Admitting: Neurology

## 2018-10-25 VITALS — Ht 68.0 in | Wt 150.0 lb

## 2018-10-25 DIAGNOSIS — G40009 Localization-related (focal) (partial) idiopathic epilepsy and epileptic syndromes with seizures of localized onset, not intractable, without status epilepticus: Secondary | ICD-10-CM | POA: Diagnosis not present

## 2018-10-25 DIAGNOSIS — R519 Headache, unspecified: Secondary | ICD-10-CM

## 2018-10-25 DIAGNOSIS — R4182 Altered mental status, unspecified: Secondary | ICD-10-CM

## 2018-10-25 MED ORDER — DIVALPROEX SODIUM 500 MG PO DR TAB: DELAYED_RELEASE_TABLET | ORAL | 6 refills | Status: DC

## 2018-10-25 MED ORDER — ZONISAMIDE 100 MG PO CAPS: 400.0000 mg | ORAL_CAPSULE | Freq: Every day | ORAL | 3 refills | Status: DC

## 2018-10-25 MED ORDER — AMITRIPTYLINE HCL 50 MG PO TABS: 150.0000 mg | ORAL_TABLET | Freq: Every day | ORAL | 3 refills | Status: DC
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
## 2018-10-26 ENCOUNTER — Ambulatory Visit (INDEPENDENT_AMBULATORY_CARE_PROVIDER_SITE_OTHER): Payer: Medicare HMO | Admitting: Neurology

## 2018-10-26 ENCOUNTER — Other Ambulatory Visit: Payer: Self-pay

## 2018-10-26 DIAGNOSIS — R519 Headache, unspecified: Secondary | ICD-10-CM

## 2018-10-26 DIAGNOSIS — G40009 Localization-related (focal) (partial) idiopathic epilepsy and epileptic syndromes with seizures of localized onset, not intractable, without status epilepticus: Secondary | ICD-10-CM

## 2018-10-26 DIAGNOSIS — R51 Headache: Secondary | ICD-10-CM

## 2018-10-27 ENCOUNTER — Telehealth: Payer: Self-pay

## 2018-10-27 NOTE — Telephone Encounter (Signed)
Notified sister of results of EEg

## 2018-10-27 NOTE — Procedures (Signed)
ELECTROENCEPHALOGRAM REPORT  Date of Study: 10/26/2018  Patient's Name: Matthew Nguyen MRN: [id number] Date of Birth: [birth year]  Referring Provider: Dr. Ellouise Newer  Clinical History: This is a 48 year old man with a history of seizures with recent increase in seizure frequency and change in mental status. EEG to assess for subclinical seizures.   Medications: Zonisamide, Depakote, amitriptyline, Librium  Technical Summary: A multichannel digital 1-hour EEG recording measured by the international 10-20 system with electrodes applied with paste and impedances below 5000 ohms performed in our laboratory with EKG monitoring in an awake and asleep patient.  Hyperventilation was not performed. hotic stimulation was performed.  The digital EEG was referentially recorded, reformatted, and digitally filtered in a variety of bipolar and referential montages for optimal display.    Description: The patient is awake and asleep during the recording.  During maximal wakefulness, there is a symmetric, medium voltage 9 Hz posterior dominant rhythm that attenuates with eye opening.  The record is symmetric.  There is an excess amount of diffuse low voltage beta activity seen throughout the recording. During drowsiness and sleep, there is an increase in theta slowing of the background. Vertex waves and symmetric sleep spindles were seen.  Photic stimulation did not elicit any abnormalities.  There were no epileptiform discharges or electrographic seizures seen.    EKG lead showed sinus tachycardia at 102 bpm.  Impression: This awake and asleep EEG is normal except for excess amount of diffuse low voltage beta activity.  Clinical Correlation: Diffuse low voltage beta activity is commonly seen with sedating medications such as benzodiazepines (patient on Librium).  In the absence of sedating medications, anxiety and hyperthyroidism may produce generalized beta activity.  The absence of epileptiform  discharges does not exclude a clinical diagnosis of epilepsy.  If further clinical questions remain, prolonged EEG may be helpful.  Clinical correlation is advised.   Ellouise Newer, M.D.

## 2018-11-04 ENCOUNTER — Other Ambulatory Visit: Payer: Self-pay

## 2018-11-04 MED ORDER — DIVALPROEX SODIUM 500 MG PO DR TAB: DELAYED_RELEASE_TABLET | ORAL | 6 refills | Status: DC

## 2018-11-08 ENCOUNTER — Ambulatory Visit: Payer: Medicare HMO | Admitting: Neurology

## 2018-11-14 ENCOUNTER — Ambulatory Visit
Admission: RE | Admit: 2018-11-14 | Discharge: 2018-11-14 | Disposition: A | Discharge status: Home / Self Care | Payer: Medicare HMO | Source: Ambulatory Visit | Attending: Neurology | Admitting: Neurology

## 2018-11-14 ENCOUNTER — Other Ambulatory Visit: Payer: Self-pay

## 2018-11-14 DIAGNOSIS — R4182 Altered mental status, unspecified: Secondary | ICD-10-CM

## 2018-11-14 MED ORDER — GADOBENATE DIMEGLUMINE 529 MG/ML IV SOLN
14.0000 mL | Freq: Once | INTRAVENOUS | Status: AC | PRN
  Administered 2018-11-14: 14 mL via INTRAVENOUS

## 2018-11-16 ENCOUNTER — Telehealth: Payer: Self-pay

## 2018-11-16 NOTE — Telephone Encounter (Signed)
-----   Message from Cameron Sprang, MD sent at 11/14/2018  9:56 AM EDT ----- Pls call sister and let her know that the MRI brain looks good, no evidence of tumor, stroke, or bleed. How is he doing? The confusion may have been due to the increase in seizures, is he better on the higher dose of Depakote and reducing alcohol? Thanks

## 2018-11-16 NOTE — Telephone Encounter (Signed)
Left message on sisters cell to return call.

## 2018-11-16 NOTE — Telephone Encounter (Signed)
Looks like I have an opening on 8/26 at 3:30, if they can do visit then, thanks!

## 2018-11-16 NOTE — Telephone Encounter (Signed)
Sister made aware of results. The pt has been doing well with the increase of Depakote. Sister states that pt has decreased his alcohol intake.  I did not see a one month follow up. Where should I schedule?

## 2018-11-16 NOTE — Telephone Encounter (Signed)
Appt scheduled

## 2018-12-07 ENCOUNTER — Ambulatory Visit: Payer: Medicare HMO | Admitting: Neurology

## 2018-12-22 ENCOUNTER — Other Ambulatory Visit: Payer: Self-pay

## 2018-12-22 ENCOUNTER — Telehealth (INDEPENDENT_AMBULATORY_CARE_PROVIDER_SITE_OTHER): Payer: Medicare HMO | Admitting: Neurology

## 2018-12-22 ENCOUNTER — Encounter: Payer: Self-pay | Admitting: Neurology

## 2018-12-22 VITALS — Ht 68.0 in | Wt 158.0 lb

## 2018-12-22 DIAGNOSIS — G43709 Chronic migraine without aura, not intractable, without status migrainosus: Secondary | ICD-10-CM

## 2018-12-22 DIAGNOSIS — G40009 Localization-related (focal) (partial) idiopathic epilepsy and epileptic syndromes with seizures of localized onset, not intractable, without status epilepticus: Secondary | ICD-10-CM

## 2018-12-22 DIAGNOSIS — IMO0002 Reserved for concepts with insufficient information to code with codable children: Secondary | ICD-10-CM

## 2018-12-22 DIAGNOSIS — R519 Headache, unspecified: Secondary | ICD-10-CM
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
## 2018-12-27 MED ORDER — EMGALITY 120 MG/ML ~~LOC~~ SOAJ: 1.0000 "pen " | SUBCUTANEOUS | 11 refills | Status: DC

## 2019-01-03 ENCOUNTER — Encounter: Payer: Self-pay | Admitting: *Deleted

## 2019-01-03 NOTE — Progress Notes (Addendum)
Jeanphilippe Kyle (Key: [id number]) Rx #: [id number] Emgality 120MG /ML auto-injectors (migraine)   Form [employer] Electronic PA Form Created 7 days ago Sent to Plan 1 day ago Plan Response 1 day ago Submit Clinical Questions less than a minute ago Determination Wait for Determination Please wait for [facility] NCPDP 2017 to return a determination.

## 2019-01-05 ENCOUNTER — Telehealth: Payer: Self-pay

## 2019-01-05 NOTE — Telephone Encounter (Signed)
Emaglity 120mg /ml pen approved until 04/05/19  [employer] informed.

## 2019-01-05 NOTE — Progress Notes (Signed)
Matthew Nguyen (Key: [id number]) Rx #: [id number] Emgality 120MG /ML auto-injectors (migraine)   Form [employer] Electronic PA Form Created 8 days ago Sent to Plan 2 days ago Plan Response 2 days ago Submit Clinical Questions 22 hours ago Determination Favorable 14 hours ago Message from Plan PA Case: [id number], Status: Approved, Coverage Starts on: 01/05/2019 12:00:00 AM, Coverage Ends on: 04/05/2019 12:00:00 AM. Questions? Contact [phone].

## 2019-02-21 ENCOUNTER — Other Ambulatory Visit: Payer: Self-pay

## 2019-02-21 MED ORDER — EMGALITY 120 MG/ML ~~LOC~~ SOAJ: 1.0000 "pen " | SUBCUTANEOUS | 2 refills | Status: DC

## 2019-04-04 ENCOUNTER — Other Ambulatory Visit: Payer: Self-pay | Admitting: Neurology

## 2019-04-10 ENCOUNTER — Encounter: Payer: Self-pay | Admitting: *Deleted

## 2019-04-10 NOTE — Progress Notes (Unsigned)
Cline Cools (Key: [id number]) Emgality 120MG /ML auto-injectors (migraine)   Form [employer] Electronic PA Form Created 3 days ago Sent to Plan less than a minute ago Determination Wait for Questions [facility] NCPDP 2017 typically responds with questions in less than 15 minutes, but may take up to 24 hours.

## 2019-05-24 ENCOUNTER — Ambulatory Visit: Payer: Medicare HMO | Admitting: Neurology

## 2019-05-25 NOTE — Progress Notes (Signed)
KEY [id number] PA Case: [id number], Status: Approved, Coverage Starts on: 04/14/2019 12:00:00 AM, Coverage Ends on: 04/12/2020 12:00:00 AM. Questions? Contact [phone]

## 2019-06-06 ENCOUNTER — Encounter: Payer: Self-pay | Admitting: Neurology

## 2019-06-06 ENCOUNTER — Telehealth (INDEPENDENT_AMBULATORY_CARE_PROVIDER_SITE_OTHER): Payer: Medicare HMO | Admitting: Neurology

## 2019-06-06 ENCOUNTER — Other Ambulatory Visit: Payer: Self-pay

## 2019-06-06 VITALS — Ht 68.0 in | Wt 159.0 lb

## 2019-06-06 DIAGNOSIS — G40009 Localization-related (focal) (partial) idiopathic epilepsy and epileptic syndromes with seizures of localized onset, not intractable, without status epilepticus: Secondary | ICD-10-CM

## 2019-06-06 DIAGNOSIS — G43709 Chronic migraine without aura, not intractable, without status migrainosus: Secondary | ICD-10-CM

## 2019-06-06 DIAGNOSIS — IMO0002 Reserved for concepts with insufficient information to code with codable children: Secondary | ICD-10-CM

## 2019-06-06 DIAGNOSIS — R519 Headache, unspecified: Secondary | ICD-10-CM | POA: Diagnosis not present

## 2019-06-06 MED ORDER — DIVALPROEX SODIUM 500 MG PO DR TAB: DELAYED_RELEASE_TABLET | ORAL | 3 refills | Status: DC

## 2019-06-06 MED ORDER — SUMATRIPTAN SUCCINATE 50 MG PO TABS: 50.0000 mg | ORAL_TABLET | ORAL | 11 refills | Status: DC | PRN

## 2019-06-06 MED ORDER — EMGALITY 120 MG/ML ~~LOC~~ SOAJ: 1.0000 "pen " | SUBCUTANEOUS | 11 refills | Status: DC

## 2019-06-06 MED ORDER — OXCARBAZEPINE 300 MG PO TABS: ORAL_TABLET | ORAL | 3 refills | Status: DC
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
## 2019-07-07 ENCOUNTER — Telehealth: Payer: Self-pay

## 2019-07-07 NOTE — Telephone Encounter (Signed)
Pt called unable to leave a voice mail, needs to have lab work done. Needs BMP need to know what lab to send order

## 2019-07-07 NOTE — Telephone Encounter (Signed)
-----   Message from Cameron Sprang, MD sent at 07/07/2019 12:48 PM EDT ----- Regarding: sodium level Can you pls call his sister and let her know that I would like to do bloodwork now that he is on the new medication to make sure his sodium level is okay? Pls order BMP and see where they want to do lab, thanks!!Santiago Glad ----- Message ----- From: Cameron Sprang, MD Sent: 06/06/2019  11:06 PM EDT To: Cameron Sprang, MD Subject: sodium                                         check

## 2019-07-11 ENCOUNTER — Telehealth: Payer: Self-pay

## 2019-07-11 DIAGNOSIS — Z79899 Other long term (current) drug therapy: Secondary | ICD-10-CM

## 2019-07-11 NOTE — Telephone Encounter (Signed)
Pt called twice with no answer and voice mail is full to inform that lab work is needed. Will send a my chart message to the pt. BMP order placed in the chart

## 2019-07-18 ENCOUNTER — Other Ambulatory Visit: Payer: Self-pay

## 2019-07-18 ENCOUNTER — Other Ambulatory Visit (INDEPENDENT_AMBULATORY_CARE_PROVIDER_SITE_OTHER): Payer: Medicare HMO

## 2019-07-18 DIAGNOSIS — Z79899 Other long term (current) drug therapy: Secondary | ICD-10-CM | POA: Diagnosis not present

## 2019-07-18 LAB — BASIC METABOLIC PANEL
BUN: 8 mg/dL (ref 6–23)
CO2: 28 mEq/L (ref 19–32)
Calcium: 9.1 mg/dL (ref 8.4–10.5)
Chloride: 90 mEq/L — ABNORMAL LOW (ref 96–112)
Creatinine, Ser: 1.14 mg/dL (ref 0.40–1.50)
GFR: 68.02 mL/min (ref >60.00)
Glucose, Bld: 129 mg/dL — ABNORMAL HIGH (ref 70–99)
Potassium: 3.8 mEq/L (ref 3.5–5.1)
Sodium: 127 mEq/L — ABNORMAL LOW (ref 135–145)

## 2019-07-18 NOTE — Addendum Note (Signed)
Addended by: Kaylyn Lim I on: 07/18/2019 01:48 PM   Modules accepted: Orders

## 2019-07-24 ENCOUNTER — Other Ambulatory Visit: Payer: Self-pay

## 2019-07-24 DIAGNOSIS — Z79899 Other long term (current) drug therapy: Secondary | ICD-10-CM

## 2019-08-15 ENCOUNTER — Other Ambulatory Visit: Payer: Self-pay | Admitting: Neurology

## 2019-09-12 ENCOUNTER — Telehealth: Payer: Self-pay | Admitting: Neurology

## 2019-09-12 NOTE — Telephone Encounter (Signed)
[name] said that she just  Saw in the Mychart for patient that he needed to have a repeat blood work done but that was like a month ago. She wants to know if he still needs to have that done. Please call [name]   Patient appt had to resch from 09-13-19 to  04-08-20 with Delice Lesch

## 2019-09-12 NOTE — Telephone Encounter (Signed)
Spoke to pt sister she will bring him next week to the the NA level checked

## 2019-09-13 ENCOUNTER — Ambulatory Visit: Payer: Medicare HMO | Admitting: Neurology

## 2019-10-04 ENCOUNTER — Ambulatory Visit: Payer: Medicare HMO | Admitting: Neurology

## 2019-11-16 ENCOUNTER — Other Ambulatory Visit: Payer: Self-pay | Admitting: Neurology

## 2019-11-21 ENCOUNTER — Ambulatory Visit: Payer: Medicare HMO | Admitting: Neurology

## 2019-12-06 ENCOUNTER — Other Ambulatory Visit: Payer: Self-pay | Admitting: Neurology

## 2020-01-22 ENCOUNTER — Other Ambulatory Visit: Payer: Self-pay

## 2020-01-22 ENCOUNTER — Observation Stay (HOSPITAL_COMMUNITY)
Admission: EM | Admit: 2020-01-22 | Discharge: 2020-01-24 | Disposition: A | Discharge status: Home / Self Care | Payer: Medicare HMO | Attending: Internal Medicine | Admitting: Internal Medicine

## 2020-01-22 ENCOUNTER — Emergency Department (HOSPITAL_COMMUNITY): Payer: Medicare HMO

## 2020-01-22 ENCOUNTER — Encounter (HOSPITAL_COMMUNITY): Payer: Self-pay | Admitting: Nephrology

## 2020-01-22 DIAGNOSIS — Z20822 Contact with and (suspected) exposure to covid-19: Secondary | ICD-10-CM | POA: Diagnosis not present

## 2020-01-22 DIAGNOSIS — R569 Unspecified convulsions: Secondary | ICD-10-CM

## 2020-01-22 DIAGNOSIS — K219 Gastro-esophageal reflux disease without esophagitis: Secondary | ICD-10-CM | POA: Insufficient documentation

## 2020-01-22 DIAGNOSIS — Z79899 Other long term (current) drug therapy: Secondary | ICD-10-CM | POA: Insufficient documentation

## 2020-01-22 DIAGNOSIS — R519 Headache, unspecified: Secondary | ICD-10-CM | POA: Diagnosis not present

## 2020-01-22 DIAGNOSIS — R109 Unspecified abdominal pain: Secondary | ICD-10-CM | POA: Diagnosis present

## 2020-01-22 DIAGNOSIS — R112 Nausea with vomiting, unspecified: Secondary | ICD-10-CM | POA: Diagnosis present

## 2020-01-22 DIAGNOSIS — R1084 Generalized abdominal pain: Secondary | ICD-10-CM

## 2020-01-22 DIAGNOSIS — K649 Unspecified hemorrhoids: Secondary | ICD-10-CM | POA: Diagnosis not present

## 2020-01-22 DIAGNOSIS — R195 Other fecal abnormalities: Secondary | ICD-10-CM | POA: Diagnosis not present

## 2020-01-22 DIAGNOSIS — K921 Melena: Principal | ICD-10-CM | POA: Diagnosis present

## 2020-01-22 DIAGNOSIS — K922 Gastrointestinal hemorrhage, unspecified: Secondary | ICD-10-CM

## 2020-01-22 DIAGNOSIS — F17200 Nicotine dependence, unspecified, uncomplicated: Secondary | ICD-10-CM | POA: Diagnosis not present

## 2020-01-22 DIAGNOSIS — R933 Abnormal findings on diagnostic imaging of other parts of digestive tract: Secondary | ICD-10-CM

## 2020-01-22 DIAGNOSIS — K5900 Constipation, unspecified: Secondary | ICD-10-CM

## 2020-01-22 LAB — CBC WITH DIFFERENTIAL/PLATELET
Abs Immature Granulocytes: 0.14 10*3/uL — ABNORMAL HIGH (ref 0.00–0.07)
Basophils Absolute: 0.1 10*3/uL (ref 0.0–0.1)
Basophils Relative: 0 %
Eosinophils Absolute: 1.9 10*3/uL — ABNORMAL HIGH (ref 0.0–0.5)
Eosinophils Relative: 11 %
HCT: 37.9 % — ABNORMAL LOW (ref 39.0–52.0)
Hemoglobin: 12.7 g/dL — ABNORMAL LOW (ref 13.0–17.0)
Immature Granulocytes: 1 %
Lymphocytes Relative: 9 %
Lymphs Abs: 1.5 10*3/uL (ref 0.7–4.0)
MCH: 32.9 pg (ref 26.0–34.0)
MCHC: 33.5 g/dL (ref 30.0–36.0)
MCV: 98.2 fL (ref 80.0–100.0)
Monocytes Absolute: 1 10*3/uL (ref 0.1–1.0)
Monocytes Relative: 6 %
Neutro Abs: 12.2 10*3/uL — ABNORMAL HIGH (ref 1.7–7.7)
Neutrophils Relative %: 73 %
Platelets: 335 10*3/uL (ref 150–400)
RBC: 3.86 MIL/uL — ABNORMAL LOW (ref 4.22–5.81)
RDW: 13.2 % (ref 11.5–15.5)
WBC: 16.8 10*3/uL — ABNORMAL HIGH (ref 4.0–10.5)
nRBC: 0 % (ref 0.0–0.2)

## 2020-01-22 LAB — CBC
HCT: 31.6 % — ABNORMAL LOW (ref 39.0–52.0)
Hemoglobin: 10.8 g/dL — ABNORMAL LOW (ref 13.0–17.0)
MCH: 33.1 pg (ref 26.0–34.0)
MCHC: 34.2 g/dL (ref 30.0–36.0)
MCV: 96.9 fL (ref 80.0–100.0)
Platelets: 298 10*3/uL (ref 150–400)
RBC: 3.26 MIL/uL — ABNORMAL LOW (ref 4.22–5.81)
RDW: 13.1 % (ref 11.5–15.5)
WBC: 10.9 10*3/uL — ABNORMAL HIGH (ref 4.0–10.5)
nRBC: 0 % (ref 0.0–0.2)

## 2020-01-22 LAB — COMPREHENSIVE METABOLIC PANEL
ALT: 13 U/L (ref 0–44)
AST: 15 U/L (ref 15–41)
Albumin: 3.7 g/dL (ref 3.5–5.0)
Alkaline Phosphatase: 47 U/L (ref 38–126)
Anion gap: 12 (ref 5–15)
BUN: 36 mg/dL — ABNORMAL HIGH (ref 6–20)
CO2: 22 mmol/L (ref 22–32)
Calcium: 9.2 mg/dL (ref 8.9–10.3)
Chloride: 99 mmol/L (ref 98–111)
Creatinine, Ser: 0.94 mg/dL (ref 0.61–1.24)
GFR, Estimated: >60 mL/min (ref >60)
Glucose, Bld: 108 mg/dL — ABNORMAL HIGH (ref 70–99)
Potassium: 3.6 mmol/L (ref 3.5–5.1)
Sodium: 133 mmol/L — ABNORMAL LOW (ref 135–145)
Total Bilirubin: 0.5 mg/dL (ref 0.3–1.2)
Total Protein: 6.8 g/dL (ref 6.5–8.1)

## 2020-01-22 LAB — POC OCCULT BLOOD, ED: Fecal Occult Bld: POSITIVE — AB

## 2020-01-22 LAB — RESP PANEL BY RT PCR (RSV, FLU A&B, COVID)
Influenza A by PCR: NEGATIVE
Influenza B by PCR: NEGATIVE
Respiratory Syncytial Virus by PCR: NEGATIVE
SARS Coronavirus 2 by RT PCR: NEGATIVE

## 2020-01-22 LAB — LIPASE, BLOOD: Lipase: 24 U/L (ref 11–51)

## 2020-01-22 MED ORDER — DIVALPROEX SODIUM ER 500 MG PO TB24: 2000.0000 mg | ORAL_TABLET | Freq: Every day | ORAL | Status: DC

## 2020-01-22 MED ORDER — OXCARBAZEPINE 300 MG PO TABS
1200.0000 mg | ORAL_TABLET | Freq: Every day | ORAL | Status: DC
  Administered 2020-01-22 – 2020-01-23 (×2): 1200 mg via ORAL
  Filled 2020-01-22 (×4): qty 4

## 2020-01-22 MED ORDER — ALPRAZOLAM 0.25 MG PO TABS
0.2500 mg | ORAL_TABLET | Freq: Three times a day (TID) | ORAL | Status: DC | PRN
  Administered 2020-01-22 – 2020-01-24 (×4): 0.25 mg via ORAL
  Filled 2020-01-22 (×4): qty 1

## 2020-01-22 MED ORDER — ONDANSETRON HCL 4 MG/2ML IJ SOLN
4.0000 mg | Freq: Four times a day (QID) | INTRAMUSCULAR | Status: DC | PRN
  Administered 2020-01-22 – 2020-01-24 (×5): 4 mg via INTRAVENOUS
  Filled 2020-01-22 (×5): qty 2

## 2020-01-22 MED ORDER — SODIUM CHLORIDE 0.45 % IV SOLN: INTRAVENOUS | Status: AC

## 2020-01-22 MED ORDER — PANTOPRAZOLE SODIUM 40 MG IV SOLR
40.0000 mg | Freq: Once | INTRAVENOUS | Status: AC
  Administered 2020-01-22: 40 mg via INTRAVENOUS
  Filled 2020-01-22: qty 40

## 2020-01-22 MED ORDER — SODIUM CHLORIDE 0.9 % IV SOLN: 250.0000 mL | INTRAVENOUS | Status: DC | PRN

## 2020-01-22 MED ORDER — ONDANSETRON HCL 4 MG PO TABS: 4.0000 mg | ORAL_TABLET | Freq: Four times a day (QID) | ORAL | Status: DC | PRN

## 2020-01-22 MED ORDER — IOHEXOL 300 MG/ML  SOLN
100.0000 mL | Freq: Once | INTRAMUSCULAR | Status: AC | PRN
  Administered 2020-01-22: 100 mL via INTRAVENOUS

## 2020-01-22 MED ORDER — DIVALPROEX SODIUM ER 500 MG PO TB24
2000.0000 mg | ORAL_TABLET | Freq: Every day | ORAL | Status: DC
  Administered 2020-01-22 – 2020-01-23 (×2): 2000 mg via ORAL
  Filled 2020-01-22 (×2): qty 4

## 2020-01-22 MED ORDER — AMITRIPTYLINE HCL 25 MG PO TABS
150.0000 mg | ORAL_TABLET | Freq: Every day | ORAL | Status: DC
  Administered 2020-01-22 – 2020-01-23 (×2): 150 mg via ORAL
  Filled 2020-01-22 (×4): qty 6

## 2020-01-22 MED ORDER — ONDANSETRON HCL 4 MG/2ML IJ SOLN
4.0000 mg | Freq: Once | INTRAMUSCULAR | Status: AC
  Administered 2020-01-22: 4 mg via INTRAVENOUS
  Filled 2020-01-22: qty 2

## 2020-01-22 MED ORDER — ACETAMINOPHEN 325 MG PO TABS
650.0000 mg | ORAL_TABLET | Freq: Four times a day (QID) | ORAL | Status: DC | PRN
  Administered 2020-01-22: 650 mg via ORAL
  Filled 2020-01-22: qty 2

## 2020-01-22 MED ORDER — PANTOPRAZOLE SODIUM 40 MG IV SOLR
40.0000 mg | INTRAVENOUS | Status: DC
  Administered 2020-01-23: 40 mg via INTRAVENOUS
  Filled 2020-01-22: qty 40

## 2020-01-22 MED ORDER — SODIUM CHLORIDE 0.9 % IV BOLUS
1000.0000 mL | Freq: Once | INTRAVENOUS | Status: AC
  Administered 2020-01-22: 1000 mL via INTRAVENOUS

## 2020-01-22 MED ORDER — SODIUM CHLORIDE 0.9% FLUSH
3.0000 mL | Freq: Two times a day (BID) | INTRAVENOUS | Status: DC
  Administered 2020-01-23 – 2020-01-24 (×3): 3 mL via INTRAVENOUS

## 2020-01-22 MED ORDER — SODIUM CHLORIDE 0.9% FLUSH: 3.0000 mL | INTRAVENOUS | Status: DC | PRN

## 2020-01-22 MED ORDER — PANTOPRAZOLE SODIUM 40 MG IV SOLR: 40.0000 mg | Freq: Two times a day (BID) | INTRAVENOUS | Status: DC

## 2020-01-22 MED ORDER — ACETAMINOPHEN 650 MG RE SUPP
650.0000 mg | Freq: Four times a day (QID) | RECTAL | Status: DC | PRN
  Administered 2020-01-23: 650 mg via RECTAL
  Filled 2020-01-22: qty 1

## 2020-01-22 MED ORDER — ZONISAMIDE 100 MG PO CAPS
400.0000 mg | ORAL_CAPSULE | Freq: Every day | ORAL | Status: DC
  Administered 2020-01-22 – 2020-01-23 (×2): 400 mg via ORAL
  Filled 2020-01-22 (×4): qty 4

## 2020-01-22 MED ORDER — LORAZEPAM 1 MG PO TABS
1.0000 mg | ORAL_TABLET | Freq: Once | ORAL | Status: AC
  Administered 2020-01-22: 1 mg via ORAL
  Filled 2020-01-22: qty 1

## 2020-01-22 MED ORDER — DIVALPROEX SODIUM 250 MG PO DR TAB: 2000.0000 mg | DELAYED_RELEASE_TABLET | Freq: Every day | ORAL | Status: DC

## 2020-01-22 NOTE — ED Notes (Signed)
Patient has a seizure disorder, c/o dizziness and states he may be passing blood in his stool. States he finally had a bowel movement after taking laxatives

## 2020-01-22 NOTE — ED Notes (Signed)
I assumed care of this patient at this time. Pt appears to be resting in bed, respirations are even and unlabored with equal chest rise and fall. Bed is locked in the lowest position, side rails x2. All questions and concerns voiced addressed at this time. Provider to bedside for assessment.

## 2020-01-22 NOTE — H&P (Addendum)
[facility] History & Physical  [name], hospital MD  Matthew Nguyen 01/22/2020  Chief Complaint: Black tarry stool HPI: The patient is a 49 y.o. year-old w/ hx of ulcers, Barrett's esophagitis, seizures, migraine HA's, GERD, hx umbilical hernia repair w/ mesh presents w/ 3 days of mid abd pain, nausea/ vomiting and this am had large dark tarry stool. Hx chronic headaches and takes [employer] powders regularly for this. In ED stool was guaiac positive x 2, Hb 12.  GI called and requesting admission for upper endoscopy tomorrow.   Pt started w/ mid abd pain and n/v about 3 days ago, hasn't been able to keep anything down.  Dark black tarry stool this am, no BRBPR.  Takes tums OTC, no PPI.  No fevers, CP, SOB.  No diarrhea.   ROS  denies CP  no joint pain   no HA  no blurry vision  no rash  no dysuria  no difficulty voiding  no change in urine color   Past Medical History  Past Medical History:  Diagnosis Date  . GERD (gastroesophageal reflux disease)   . Migraines   . Seizure [facility])    Past Surgical History  Past Surgical History:  Procedure Laterality Date  . UMBILICAL HERNIA REPAIR N/A 06/06/2018   Procedure: HERNIA REPAIR UMBILICAL ADULT WITH MESH;  Surgeon: Aviva Signs, MD;  Location: [facility];  Service: General;  Laterality: N/A;   Family History  Family History  Problem Relation Age of Onset  . Heart disease Father    Social History  reports that he has been smoking. He has never used smokeless tobacco. He reports current alcohol use. He reports current drug use. Drug: Marijuana. Allergies  Allergies  Allergen Reactions  . Morphine Nausea Only   Home medications Prior to Admission medications   Medication Sig Start Date End Date Taking? Authorizing Provider  amitriptyline (ELAVIL) 50 MG tablet TAKE 3 TABLETS (150 MG TOTAL) BY MOUTH AT BEDTIME. 12/07/19  Yes Cameron Sprang, MD  divalproex (DEPAKOTE) 500 MG DR tablet Take 2 tablets in the morning and two tablets at  night. 06/06/19  Yes Cameron Sprang, MD  Galcanezumab-gnlm [facility]) 120 MG/ML SOAJ Inject 1 pen into the skin every 30 (thirty) days. 06/06/19  Yes Cameron Sprang, MD  Oxcarbazepine (TRILEPTAL) 300 MG tablet Take 2 tablets twice a day 06/06/19  Yes Cameron Sprang, MD  pantoprazole (PROTONIX) 40 MG tablet Take 40 mg by mouth daily.   Yes [provider]  SUMAtriptan (IMITREX) 50 MG tablet Take 1 tablet (50 mg total) by mouth every 2 (two) hours as needed for migraine or headache. May repeat in 2 hours if headache persists or recurs. 06/06/19  Yes Cameron Sprang, MD  zonisamide (ZONEGRAN) 100 MG capsule TAKE 4 CAPSULES AT BEDTIME. 08/15/19  Yes Cameron Sprang, MD  dicyclomine (BENTYL) 20 MG tablet Take 20 mg by mouth 3 (three) times daily as needed. Patient not taking: Reported on 01/22/2020 12/07/18   [provider]  sucralfate (CARAFATE) 1 GM/10ML suspension Take 1 g by mouth 4 (four) times daily. Patient not taking: Reported on 01/22/2020 04/04/19   [provider]  zolpidem (AMBIEN) 5 MG tablet Take 5 mg by mouth at bedtime. 08/12/15 06/06/19  [provider]       Exam Gen alert, calm and pleasant No rash, cyanosis or gangrene Sclera anicteric, throat clear  No jvd or bruits Chest clear bilat to bases RRR no MRG Abd soft ntnd  no mass or ascites +bs, no rebound  GU normal male MS no joint effusions or deformity Ext no LE or UE edema, no wounds or ulcers Neuro is alert, Ox 3 , nf    Home meds:  - depakote er 1000mg  bid/ zonisamide 400 mg hs/ trileptal 200 bid  - sumatriptan 50 mg bid prn HA/ emgality sq 120mg  monthly/ elavil 150 hs  - protonix 40 qd/ carafate 1 gm qid  - prn's/ vitamins/ supplements      Assessment/ Plan: 1. Abd pain/ nausea /vomiting - possibly PUD vs other. Clear liquid diet, npo after MN, IV ppi, antiemetics.  2. Melena/ guiac + - hx of PUD in the past. GI called by ED , they are planning for upper endoscopy tomorrow.  NPO after MN. Clear liquids for now.  3. Seizure d/o - on 3 seizure meds, takes them all at night instead of bid (he has gotten okay for this from his neurologist).  I spoke w/ pharm and will give all his seizure meds qhs for now.  4. Hx of migraines - taking elavil and monthly injections, not active today      Kelly Splinter  MD 01/22/2020, 6:11 PM

## 2020-01-22 NOTE — ED Triage Notes (Signed)
Abdominal pain with vomiting onset 3 days ago 

## 2020-01-22 NOTE — ED Provider Notes (Signed)
[facility] EMERGENCY DEPARTMENT Provider Note   CSN: [id number] Arrival date & time: 01/22/20  1059     History Chief Complaint  Patient presents with  . Abdominal Pain  . Emesis    Matthew Nguyen is a 49 y.o. male with PMH significant for GERD and internal hemorrhoids who presents the ED with a 3-day history of generalized abdominal pain, nausea, and vomiting superimposed on a 14-day history of constipation.  Patient reports that he has been taking laxative medications obtained over-the-counter at [facility], with little relief.  He had a small "marble" bowel movement on 01/16/2020.  He then had a bowel movement this morning that was "jet black" with a small amount of BRBPR.  Patient states that he has had upper and lower endoscopies obtained in the past that revealed ulcers and internal hemorrhoids.  He states that he had been followed by a gastroenterologist in [place], [place] but did not like him.  He takes [employer] Tums, but no proton pump inhibitors or other antacid medications.  He admits that he does not focus on healthy diet.  Patient denies any fevers or chills, body aches, chest pain or shortness of breath, cough, hematemesis, or urinary symptoms.  Patient does report that he has been taking Goody powders recently for mild headache symptoms, worse from vomiting.  I spoke with his sister, [name], who confirms that he has a history of PUD and Barrett's esophagus.  They are aware of the adrenal gland mass.  HPI     Past Medical History:  Diagnosis Date  . GERD (gastroesophageal reflux disease)   . Migraines   . Seizure [facility])     Patient Active Problem List   Diagnosis Date Noted  . Umbilical hernia without obstruction and without gangrene   . Chronic daily headache 10/27/2017  . Localization-related symptomatic epilepsy and epileptic syndromes with complex partial seizures, intractable, without status epilepticus ([place]) 05/05/2016  . Seizure-like activity ([place])  10/29/2015  . Alcohol withdrawal ([place]) 10/19/2014  . Sleep disorder 06/21/2014  . Headache 10/31/2013  . Seizures ([place]) 10/31/2013    Past Surgical History:  Procedure Laterality Date  . UMBILICAL HERNIA REPAIR N/A 06/06/2018   Procedure: HERNIA REPAIR UMBILICAL ADULT WITH MESH;  Surgeon: Aviva Signs, MD;  Location: [facility];  Service: General;  Laterality: N/A;       Family History  Problem Relation Age of Onset  . Heart disease Father     Social History   Tobacco Use  . Smoking status: Current Every Day Smoker  . Smokeless tobacco: Never Used  Vaping Use  . Vaping Use: Never used  Substance Use Topics  . Alcohol use: Yes  . Drug use: Yes    Types: Marijuana    Home Medications Prior to Admission medications   Medication Sig Start Date End Date Taking? Authorizing Provider  amitriptyline (ELAVIL) 50 MG tablet TAKE 3 TABLETS (150 MG TOTAL) BY MOUTH AT BEDTIME. 12/07/19  Yes Cameron Sprang, MD  divalproex (DEPAKOTE) 500 MG DR tablet Take 2 tablets in the morning and two tablets at night. 06/06/19  Yes Cameron Sprang, MD  Galcanezumab-gnlm [facility]) 120 MG/ML SOAJ Inject 1 pen into the skin every 30 (thirty) days. 06/06/19  Yes Cameron Sprang, MD  Oxcarbazepine (TRILEPTAL) 300 MG tablet Take 2 tablets twice a day 06/06/19  Yes Cameron Sprang, MD  pantoprazole (PROTONIX) 40 MG tablet Take 40 mg by mouth daily.   Yes [provider]  SUMAtriptan (IMITREX) 50  MG tablet Take 1 tablet (50 mg total) by mouth every 2 (two) hours as needed for migraine or headache. May repeat in 2 hours if headache persists or recurs. 06/06/19  Yes Cameron Sprang, MD  zonisamide (ZONEGRAN) 100 MG capsule TAKE 4 CAPSULES AT BEDTIME. 08/15/19  Yes Cameron Sprang, MD  dicyclomine (BENTYL) 20 MG tablet Take 20 mg by mouth 3 (three) times daily as needed. Patient not taking: Reported on 01/22/2020 12/07/18   [provider]  sucralfate (CARAFATE) 1 GM/10ML suspension Take 1 g by mouth 4  (four) times daily. Patient not taking: Reported on 01/22/2020 04/04/19   [provider]  zolpidem (AMBIEN) 5 MG tablet Take 5 mg by mouth at bedtime. 08/12/15 06/06/19  [provider]    Allergies    Morphine  Review of Systems   Review of Systems  All other systems reviewed and are negative.   Physical Exam Updated Vital Signs BP (!) 164/97 (BP Location: Right Arm)   Pulse (!) 106   Temp 98.2 F (36.8 C) (Oral)   Resp 19   SpO2 98%   Physical Exam Vitals and nursing note reviewed. Exam conducted with a chaperone present.  HENT:     Head: Normocephalic and atraumatic.  Eyes:     General: No scleral icterus.    Conjunctiva/sclera: Conjunctivae normal.  Cardiovascular:     Rate and Rhythm: Regular rhythm. Tachycardia present.     Pulses: Normal pulses.     Heart sounds: Normal heart sounds.  Pulmonary:     Effort: Pulmonary effort is normal. No respiratory distress.     Breath sounds: Normal breath sounds. No wheezing or rales.  Abdominal:     Comments: Soft, nondistended.  Scar near umbilicus from umbilical hernia repair.  TTP in suprapubic/periumbilical region.  No significant TTP elsewhere.  No guarding.  No overlying skin changes.  Skin:    General: Skin is dry.     Capillary Refill: Capillary refill takes less than 2 seconds.  Neurological:     Mental Status: He is alert and oriented to person, place, and time.     GCS: GCS eye subscore is 4. GCS verbal subscore is 5. GCS motor subscore is 6.  Psychiatric:        Mood and Affect: Mood normal.        Behavior: Behavior normal.        Thought Content: Thought content normal.     ED Results / Procedures / Treatments   Labs (all labs ordered are listed, but only abnormal results are displayed) Labs Reviewed  CBC WITH DIFFERENTIAL/PLATELET - Abnormal; Notable for the following components:      Result Value   WBC 16.8 (*)    RBC 3.86 (*)    Hemoglobin 12.7 (*)    HCT 37.9 (*)    Neutro Abs  12.2 (*)    Eosinophils Absolute 1.9 (*)    Abs Immature Granulocytes 0.14 (*)    All other components within normal limits  COMPREHENSIVE METABOLIC PANEL - Abnormal; Notable for the following components:   Sodium 133 (*)    Glucose, Bld 108 (*)    BUN 36 (*)    All other components within normal limits  POC OCCULT BLOOD, ED - Abnormal; Notable for the following components:   Fecal Occult Bld POSITIVE (*)    All other components within normal limits  LIPASE, BLOOD  URINALYSIS, ROUTINE W REFLEX MICROSCOPIC    EKG None  Radiology  CT ABDOMEN PELVIS W CONTRAST  Result Date: 01/22/2020 CLINICAL DATA:  Bowel obstruction suspected EXAM: CT ABDOMEN AND PELVIS WITH CONTRAST TECHNIQUE: Multidetector CT imaging of the abdomen and pelvis was performed using the standard protocol following bolus administration of intravenous contrast. CONTRAST:  121mL OMNIPAQUE IOHEXOL 300 MG/ML  SOLN COMPARISON:  None. FINDINGS: Lower chest: No acute abnormality.  Coronary artery calcifications. Hepatobiliary: No solid liver abnormality is seen. No gallstones, gallbladder wall thickening, or biliary dilatation. Pancreas: Unremarkable. No pancreatic ductal dilatation or surrounding inflammatory changes. Spleen: Normal in size without significant abnormality. 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 Kidneys are normal, without renal calculi, solid lesion, or hydronephrosis. Bladder is unremarkable. Stomach/Bowel: Stomach is within normal limits. Appendix appears normal. The descending colon is decompressed, however there may be some wall thickening and adjacent inflammatory stranding (series 6, image 53). There is no evidence of bowel obstruction and there is scattered gas and stool present to the rectum. Vascular/Lymphatic: Aortic atherosclerosis. No enlarged abdominal or pelvic lymph nodes. Reproductive: No mass or  other significant abnormality. Other: No abdominal wall hernia or abnormality. No abdominopelvic ascites. Musculoskeletal: No acute or significant osseous findings. IMPRESSION: 1. The descending colon is decompressed, however there may be some wall thickening and adjacent inflammatory stranding suggestive of nonspecific infectious, inflammatory, or ischemic colitis. 2. There is no evidence of bowel obstruction and there is scattered gas and stool present to the rectum. 3. 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 This is nonspecific. Given size greater than 4 cm, recommend surgical referral with consideration of biopsy, resection, or metabolic characterization by PET-CT. 4. Coronary artery disease.  Aortic Atherosclerosis (ICD10-I70.0). These results will be called to the ordering clinician or representative by the Radiologist Assistant, and communication documented in the PACS or [employer]. Electronically Signed   By: Eddie Candle M.D.   On: 01/22/2020 15:32    Procedures Procedures (including critical care time)  Medications Ordered in ED Medications  sodium chloride 0.9 % bolus 1,000 mL (0 mLs Intravenous Stopped 01/22/20 1700)  pantoprazole (PROTONIX) injection 40 mg (40 mg Intravenous Given 01/22/20 1409)  ondansetron (ZOFRAN) injection 4 mg (4 mg Intravenous Given 01/22/20 1559)  iohexol (OMNIPAQUE) 300 MG/ML solution 100 mL (100 mLs Intravenous Contrast Given 01/22/20 1507)    ED Course  I have reviewed the triage vital signs and the nursing notes.  Pertinent labs & imaging results that were available during my care of the patient were reviewed by me and considered in my medical decision making (see chart for details).  Clinical Course as of Jan 22 1719  Mon Jan 22, 2020  4401 Spoke with Dr. Abbey Chatters who plans to do endoscopy in the morning.  NPO after midnight, clear liquid until then.   [GG]  1702 Spoke with Dr.  Jonnie Finner who will see and admit patient.   [GG]    Clinical Course User Index [GG] Corena Herter, PA-C   MDM Rules/Calculators/A&P                          While patient has been afebrile, he is tachycardic here in the ED, likely due to his reported diminished p.o. intake and inability to tolerate food or liquid by mouth.  However, given his significant acidosis, cannot exclude infection as cause for his elevated  heart rate.  Labs CMP: Mild hyponatremia 133.  Most notably, BUN elevated to 36.  In setting of his reported to black stools, concerning for upper GI bleed. CBC with differential: Mild anemia with hemoglobin of 12.7, down from 14.6 and labs obtained 1 year ago.  Leukocytosis to 16.8. Lipase: WNL. UA: Pending. Point-of-care occult blood: Positive.  Given patient's history of abdominal surgery, 14-day history of constipation despite over-the-counter medications, and reported melena consistent with elevated BUN, will start patient on IV Protonix and fluids.  We will also obtain CT abdomen and pelvis with contrast to assess for obstruction or other emergent intra-abdominal pathology.  Imaging CT abdomen pelvis is personally reviewed and demonstrates nonspecific inflammatory stranding adjacent to descending colon, possibly suggestive of inflammatory or infectious colitis.  No evidence of bowel obstruction.  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  Given patient's reported melena in the context of having history of PUD, obtain fecal occult stool card which was positive.  He denies any lightheadedness or weakness.  He has mild anemia with hemoglobin 12.7, but no active bleeding or melena on my examination.  He has not had a bowel movement since this morning.  He has been mildly tachycardic, likely due to dehydration.  Improved with IV fluid resuscitation.  He feels fine would like to go  home.  Spoke with Dr. Abbey Chatters who plans to do endoscopy in the morning.  NPO after midnight, clear liquid until then.  Spoke with Dr. Jonnie Finner who will see and admit patient.  Final Clinical Impression(s) / ED Diagnoses Final diagnoses:  Upper GI bleed    Rx / DC Orders ED Discharge Orders    None       Corena Herter, PA-C 01/22/20 1720    Truddie Hidden, MD 01/23/20 [phone]

## 2020-01-23 ENCOUNTER — Encounter (HOSPITAL_COMMUNITY)
Admission: EM | Disposition: A | Discharge status: Home / Self Care | Payer: Self-pay | Source: Home / Self Care | Attending: Emergency Medicine

## 2020-01-23 ENCOUNTER — Encounter (HOSPITAL_COMMUNITY): Payer: Self-pay | Admitting: Nephrology

## 2020-01-23 ENCOUNTER — Observation Stay (HOSPITAL_COMMUNITY): Payer: Medicare HMO | Admitting: Anesthesiology

## 2020-01-23 DIAGNOSIS — K3189 Other diseases of stomach and duodenum: Secondary | ICD-10-CM

## 2020-01-23 DIAGNOSIS — K922 Gastrointestinal hemorrhage, unspecified: Secondary | ICD-10-CM

## 2020-01-23 DIAGNOSIS — K269 Duodenal ulcer, unspecified as acute or chronic, without hemorrhage or perforation: Secondary | ICD-10-CM

## 2020-01-23 DIAGNOSIS — R933 Abnormal findings on diagnostic imaging of other parts of digestive tract: Secondary | ICD-10-CM | POA: Diagnosis not present

## 2020-01-23 DIAGNOSIS — K921 Melena: Secondary | ICD-10-CM | POA: Diagnosis not present

## 2020-01-23 DIAGNOSIS — R1030 Lower abdominal pain, unspecified: Secondary | ICD-10-CM | POA: Diagnosis not present

## 2020-01-23 DIAGNOSIS — K219 Gastro-esophageal reflux disease without esophagitis: Secondary | ICD-10-CM | POA: Diagnosis not present

## 2020-01-23 DIAGNOSIS — R519 Headache, unspecified: Secondary | ICD-10-CM | POA: Diagnosis not present

## 2020-01-23 DIAGNOSIS — R569 Unspecified convulsions: Secondary | ICD-10-CM | POA: Diagnosis not present

## 2020-01-23 DIAGNOSIS — Z20822 Contact with and (suspected) exposure to covid-19: Secondary | ICD-10-CM | POA: Diagnosis not present

## 2020-01-23 DIAGNOSIS — K5900 Constipation, unspecified: Secondary | ICD-10-CM | POA: Diagnosis not present

## 2020-01-23 DIAGNOSIS — F17200 Nicotine dependence, unspecified, uncomplicated: Secondary | ICD-10-CM | POA: Diagnosis not present

## 2020-01-23 HISTORY — PX: ESOPHAGOGASTRODUODENOSCOPY (EGD) WITH PROPOFOL: SHX5813

## 2020-01-23 LAB — PROTIME-INR
INR: 1 (ref 0.8–1.2)
Prothrombin Time: 12.3 seconds (ref 11.4–15.2)

## 2020-01-23 LAB — CBC
HCT: 33.7 % — ABNORMAL LOW (ref 39.0–52.0)
Hemoglobin: 11.4 g/dL — ABNORMAL LOW (ref 13.0–17.0)
MCH: 33.2 pg (ref 26.0–34.0)
MCHC: 33.8 g/dL (ref 30.0–36.0)
MCV: 98.3 fL (ref 80.0–100.0)
Platelets: 311 10*3/uL (ref 150–400)
RBC: 3.43 MIL/uL — ABNORMAL LOW (ref 4.22–5.81)
RDW: 13.2 % (ref 11.5–15.5)
WBC: 11.5 10*3/uL — ABNORMAL HIGH (ref 4.0–10.5)
nRBC: 0 % (ref 0.0–0.2)

## 2020-01-23 LAB — HIV ANTIBODY (ROUTINE TESTING W REFLEX): HIV Screen 4th Generation wRfx: NONREACTIVE

## 2020-01-23 SURGERY — ESOPHAGOGASTRODUODENOSCOPY (EGD) WITH PROPOFOL
Anesthesia: General

## 2020-01-23 MED ORDER — MIDAZOLAM HCL 5 MG/5ML IJ SOLN
INTRAMUSCULAR | Status: DC | PRN
  Administered 2020-01-23: 2 mg via INTRAVENOUS

## 2020-01-23 MED ORDER — FENTANYL CITRATE (PF) 100 MCG/2ML IJ SOLN
INTRAMUSCULAR | Status: AC
  Filled 2020-01-23: qty 2

## 2020-01-23 MED ORDER — PROPOFOL 10 MG/ML IV BOLUS
INTRAVENOUS | Status: DC | PRN
  Administered 2020-01-23: 100 mg via INTRAVENOUS

## 2020-01-23 MED ORDER — MIDAZOLAM HCL 2 MG/2ML IJ SOLN
INTRAMUSCULAR | Status: AC
  Filled 2020-01-23: qty 2

## 2020-01-23 MED ORDER — LIDOCAINE VISCOUS HCL 2 % MT SOLN
15.0000 mL | Freq: Once | OROMUCOSAL | Status: AC
  Administered 2020-01-23: 15 mL via OROMUCOSAL

## 2020-01-23 MED ORDER — LIDOCAINE HCL URETHRAL/MUCOSAL 2 % EX GEL
CUTANEOUS | Status: AC
  Filled 2020-01-23: qty 30

## 2020-01-23 MED ORDER — LACTATED RINGERS IV SOLN: Freq: Once | INTRAVENOUS | Status: AC

## 2020-01-23 MED ORDER — GLYCOPYRROLATE 0.2 MG/ML IJ SOLN
INTRAMUSCULAR | Status: AC
  Filled 2020-01-23: qty 1

## 2020-01-23 MED ORDER — HYDROMORPHONE HCL 1 MG/ML IJ SOLN
0.2500 mg | Freq: Once | INTRAMUSCULAR | Status: AC
  Administered 2020-01-23: 0.25 mg via INTRAVENOUS
  Filled 2020-01-23: qty 0.5

## 2020-01-23 MED ORDER — LIDOCAINE 2% (20 MG/ML) 5 ML SYRINGE: INTRAMUSCULAR | Status: DC | PRN

## 2020-01-23 MED ORDER — POLYETHYLENE GLYCOL 3350 17 G PO PACK
17.0000 g | PACK | Freq: Every day | ORAL | Status: DC
  Administered 2020-01-23 – 2020-01-24 (×2): 17 g via ORAL
  Filled 2020-01-23 (×3): qty 1

## 2020-01-23 MED ORDER — PANTOPRAZOLE SODIUM 40 MG PO TBEC
40.0000 mg | DELAYED_RELEASE_TABLET | Freq: Two times a day (BID) | ORAL | Status: DC
  Administered 2020-01-23 – 2020-01-24 (×2): 40 mg via ORAL
  Filled 2020-01-23 (×3): qty 1

## 2020-01-23 MED ORDER — LIDOCAINE 2% (20 MG/ML) 5 ML SYRINGE
INTRAMUSCULAR | Status: AC
  Filled 2020-01-23: qty 5

## 2020-01-23 MED ORDER — LIDOCAINE VISCOUS HCL 2 % MT SOLN
OROMUCOSAL | Status: AC
  Filled 2020-01-23: qty 15

## 2020-01-23 MED ORDER — LIDOCAINE 2% (20 MG/ML) 5 ML SYRINGE
INTRAMUSCULAR | Status: DC | PRN
  Administered 2020-01-23: 40 mg via INTRAVENOUS

## 2020-01-23 MED ORDER — LACTATED RINGERS IV SOLN: INTRAVENOUS | Status: DC | PRN

## 2020-01-23 MED ORDER — PROPOFOL 10 MG/ML IV BOLUS
INTRAVENOUS | Status: AC
  Filled 2020-01-23: qty 40

## 2020-01-23 MED ORDER — HYDROMORPHONE HCL 1 MG/ML IJ SOLN
0.5000 mg | INTRAMUSCULAR | Status: DC | PRN
  Administered 2020-01-23 – 2020-01-24 (×5): 0.5 mg via INTRAVENOUS
  Filled 2020-01-23 (×5): qty 0.5

## 2020-01-23 MED ORDER — PROPOFOL 500 MG/50ML IV EMUL
INTRAVENOUS | Status: DC | PRN
  Administered 2020-01-23: 150 ug/kg/min via INTRAVENOUS

## 2020-01-23 MED ORDER — PANTOPRAZOLE SODIUM 40 MG IV SOLR: 40.0000 mg | Freq: Two times a day (BID) | INTRAVENOUS | Status: DC

## 2020-01-23 NOTE — Anesthesia Preprocedure Evaluation (Signed)
Anesthesia Evaluation  Patient identified by MRN, date of birth, ID band Patient awake    Reviewed: Allergy & Precautions, NPO status , Patient's Chart, lab work & pertinent test results  History of Anesthesia Complications Negative for: history of anesthetic complications  Airway Mallampati: II  TM Distance: >3 FB Neck ROM: Full    Dental  (+) Missing, Dental Advisory Given   Pulmonary Current Smoker and Patient abstained from smoking.,    Pulmonary exam normal breath sounds clear to auscultation       Cardiovascular Exercise Tolerance: Good  Rhythm:Regular Rate:Tachycardia  24-Oct-2018 08:36:10 [facility]/ED ROUTINE RECORD Sinus tachycardia Probable anteroseptal infarct, old   Neuro/Psych  Headaches, Seizures -, Poorly Controlled,  PSYCHIATRIC DISORDERS    GI/Hepatic GERD  Medicated,(+)     substance abuse  alcohol use and marijuana use, Upper GI bleeding   Endo/Other  negative endocrine ROS  Renal/GU negative Renal ROS     Musculoskeletal negative musculoskeletal ROS (+)   Abdominal   Peds  Hematology negative hematology ROS (+)   Anesthesia Other Findings   Reproductive/Obstetrics negative OB ROS                           Anesthesia Physical Anesthesia Plan  ASA: III and emergent  Anesthesia Plan: General   Post-op Pain Management:    Induction: Intravenous  PONV Risk Score and Plan: TIVA  Airway Management Planned: Nasal Cannula and Natural Airway  Additional Equipment:   Intra-op Plan:   Post-operative Plan: Possible Post-op intubation/ventilation  Informed Consent: I have reviewed the patients History and Physical, chart, labs and discussed the procedure including the risks, benefits and alternatives for the proposed anesthesia with the patient or authorized representative who has indicated his/her understanding and acceptance.     Dental  advisory given  Plan Discussed with: Surgeon  Anesthesia Plan Comments:         Anesthesia Quick Evaluation

## 2020-01-23 NOTE — Progress Notes (Signed)
PROGRESS NOTE  Matthew Nguyen [id number]:[id number] DOB: July 26, 1970 DOA: 01/22/2020 PCP: Abran Richard, MD  Brief History:  49 year old male with a history of seizure disorder, GERD, migraine headache presenting with 3-day history of nausea, vomiting, and abdominal pain.  The patient has had some constipation for the past 2 weeks.  Has been taking some over-the-counter laxatives with little relief.  On the morning of 01/22/2020, the patient had a bowel movement that he described as "jet black" with a small amount of hematochezia.  The patient states that he takes Goody's at least 20 times a week for chronic daily headache.  He also takes Tums.  He denies any fevers, chills, chest pain, shortness breath, hematemesis.  There is no dysuria or hematuria. No alcohol in the last 3 months. Used to drink heavily. He does smoke marijuana intermittently. No history of IV or intranasal drug use.  Also reports history of Barrett's esophagus and gastric ulcers that was noted on prior upper endoscopy in [place], [place] 1-2 years ago. He was prescribed PPI and Carafate but has not continued on PPI. In the emergency department, the patient was afebrile hemodynamically stable with oxygen saturation 100% room air.  Guaiac was positive.  GI was consulted to assist with management.  Assessment/Plan: Chronic blood loss anemia/melena -Hemoglobin 14.6 on 12/25/2018 -Presented with hemoglobin 12.7 -Suspect peptic ulcer disease secondary to NSAID use -GI consulted--> planning for EGD -Continue Protonix--> increase to twice daily -Clear liquid diet for now -01/22/2020 CT abdomen/pelvis--questionable wall thickening of the descending colon with inflammatory changes; right adrenal soft tissue mass  Seizure disorder -Continue home dose of Depakote, Trileptal, Zonegran -Patient follows with neurology, Dr. 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  Dispo: The patient is from: Home              Anticipated d/c is to: Home              Anticipated d/c date is: 1 day              Patient currently is not medically stable to d/c.        Family Communication:  Sister updated at bedside 10/12  Consultants:  GI  Code Status:  FULL   DVT Prophylaxis:  SCD   Procedures: As Listed in Progress Note Above  Antibiotics: None       Subjective: Patient complains of abdominal pain.  He denies any further nausea, vomiting.  He denies any fevers, chills, chest pain, shortness breath, dysuria, hematuria.  He had one episode of hematochezia since admission on toilet paper.  There is no diarrhea.  Objective: Vitals:   01/22/20 2355 01/23/20 0450 01/23/20 0847 01/23/20 1211  BP: (!) 133/99 (!) 151/105 (!) 128/96 (!) 122/93  Pulse: (!) 108 (!) 109 (!) 107 100  Resp: 20 20 18 18   Temp: 99.9 F (37.7 C) 99.8 F (37.7 C) 98.7 F (37.1 C) 98.7 F (37.1 C)  TempSrc: Oral Oral Oral Oral  SpO2: 100% 99% 98%   Weight:  70.5 kg    Height:  Intake/Output Summary (Last 24 hours) at 01/23/2020 1453 Last data filed at 01/23/2020 0451 Gross per 24 hour  Intake 1000 ml  Output 1 ml  Net 999 ml   Weight change:  Exam:   General:  Pt is alert, follows commands appropriately, not in acute distress  HEENT: No icterus, No thrush, No neck mass, [place]/AT  Cardiovascular: RRR, S1/S2, no rubs, no gallops  Respiratory: CTA bilaterally, no wheezing, no crackles, no rhonchi  Abdomen: Soft/+BS, non tender, non distended, no guarding  Extremities: No edema, No lymphangitis, No petechiae, No rashes, no synovitis   Data Reviewed: I have personally  reviewed following labs and imaging studies Basic Metabolic Panel: Recent Labs  Lab 01/22/20 1158  NA 133*  K 3.6  CL 99  CO2 22  GLUCOSE 108*  BUN 36*  CREATININE 0.94  CALCIUM 9.2   Liver Function Tests: Recent Labs  Lab 01/22/20 1158  AST 15  ALT 13  ALKPHOS 47  BILITOT 0.5  PROT 6.8  ALBUMIN 3.7   Recent Labs  Lab 01/22/20 1158  LIPASE 24   No results for input(s): AMMONIA in the last 168 hours. Coagulation Profile: Recent Labs  Lab 01/23/20 0458  INR 1.0   CBC: Recent Labs  Lab 01/22/20 1158 01/22/20 2147 01/23/20 0458  WBC 16.8* 10.9* 11.5*  NEUTROABS 12.2*  --   --   HGB 12.7* 10.8* 11.4*  HCT 37.9* 31.6* 33.7*  MCV 98.2 96.9 98.3  PLT 335 298 311   Cardiac Enzymes: No results for input(s): CKTOTAL, CKMB, CKMBINDEX, TROPONINI in the last 168 hours. BNP: Invalid input(s): POCBNP CBG: No results for input(s): GLUCAP in the last 168 hours. HbA1C: No results for input(s): HGBA1C in the last 72 hours. Urine analysis:    Component Value Date/Time   COLORURINE STRAW (A) 10/24/2018 0847   APPEARANCEUR CLEAR 10/24/2018 0847   APPEARANCEUR Clear 06/06/2011 1219   LABSPEC 1.005 10/24/2018 0847   LABSPEC 1.009 06/06/2011 1219   PHURINE 7.0 10/24/2018 [place] 10/24/2018 0847   GLUCOSEU Negative 06/06/2011 1219   HGBUR NEGATIVE 10/24/2018 0847   BILIRUBINUR NEGATIVE 10/24/2018 0847   BILIRUBINUR Negative 06/06/2011 1219   KETONESUR NEGATIVE 10/24/2018 0847   PROTEINUR NEGATIVE 10/24/2018 0847   NITRITE NEGATIVE 10/24/2018 0847   LEUKOCYTESUR NEGATIVE 10/24/2018 0847   LEUKOCYTESUR Negative 06/06/2011 1219   Sepsis Labs: @LABRCNTIP (procalcitonin:4,lacticidven:4) ) Recent Results (from the past 240 hour(s))  Resp Panel by RT PCR (RSV, Flu A&B, Covid) - Nasopharyngeal Swab     Status: None   Collection Time: 01/22/20  7:31 PM   Specimen: Nasopharyngeal Swab  Result Value Ref Range Status   SARS Coronavirus 2 by RT PCR  NEGATIVE NEGATIVE Final    Comment: (NOTE) SARS-CoV-2 target nucleic acids are NOT DETECTED.  The SARS-CoV-2 RNA is generally detectable in upper respiratoy specimens during the acute phase of infection. The lowest concentration of SARS-CoV-2 viral copies this assay can detect is 131 copies/mL. A negative result does not preclude SARS-Cov-2 infection and should not be used as the sole basis for treatment or other patient management decisions. A negative result may occur with  improper specimen collection/handling, submission of specimen other than nasopharyngeal swab, presence of viral mutation(s) within the areas targeted by this assay, and inadequate number of viral copies (<131 copies/mL). A negative result must be combined with clinical observations, patient history, and epidemiological information. The expected result is Negative.  Fact Sheet for Patients:  PinkCheek.be  Fact Sheet for  Healthcare Providers:  GravelBags.it  This test is no t yet approved or cleared by the [place] and  has been authorized for detection and/or diagnosis of SARS-CoV-2 by FDA under an Emergency Use Authorization (EUA). This EUA will remain  in effect (meaning this test can be used) for the duration of the COVID-19 declaration under Section 564(b)(1) of the Act, 21 U.S.C. section 360bbb-3(b)(1), unless the authorization is terminated or revoked sooner.     Influenza A by PCR NEGATIVE NEGATIVE Final   Influenza B by PCR NEGATIVE NEGATIVE Final    Comment: (NOTE) The Xpert Xpress SARS-CoV-2/FLU/RSV assay is intended as an aid in  the diagnosis of influenza from Nasopharyngeal swab specimens and  should not be used as a sole basis for treatment. Nasal washings and  aspirates are unacceptable for Xpert Xpress SARS-CoV-2/FLU/RSV  testing.  Fact Sheet for Patients: PinkCheek.be  Fact Sheet for  Healthcare Providers: GravelBags.it  This test is not yet approved or cleared by the [place] FDA and  has been authorized for detection and/or diagnosis of SARS-CoV-2 by  FDA under an Emergency Use Authorization (EUA). This EUA will remain  in effect (meaning this test can be used) for the duration of the  Covid-19 declaration under Section 564(b)(1) of the Act, 21  U.S.C. section 360bbb-3(b)(1), unless the authorization is  terminated or revoked.    Respiratory Syncytial Virus by PCR NEGATIVE NEGATIVE Final    Comment: (NOTE) Fact Sheet for Patients: PinkCheek.be  Fact Sheet for Healthcare Providers: GravelBags.it  This test is not yet approved or cleared by the [place] FDA and  has been authorized for detection and/or diagnosis of SARS-CoV-2 by  FDA under an Emergency Use Authorization (EUA). This EUA will remain  in effect (meaning this test can be used) for the duration of the  COVID-19 declaration under Section 564(b)(1) of the Act, 21 U.S.C.  section 360bbb-3(b)(1), unless the authorization is terminated or  revoked. Performed at [facility], [address] [place], [place] [address]      Scheduled Meds:  amitriptyline  150 mg Oral QHS   divalproex  2,000 mg Oral QHS   OXcarbazepine  1,200 mg Oral QHS   pantoprazole (PROTONIX) IV  40 mg Intravenous Q12H   sodium chloride flush  3 mL Intravenous Q12H   zonisamide  400 mg Oral QHS   Continuous Infusions:  sodium chloride      Procedures/Studies: CT ABDOMEN PELVIS W CONTRAST  Result Date: 01/22/2020 CLINICAL DATA:  Bowel obstruction suspected EXAM: CT ABDOMEN AND PELVIS WITH CONTRAST TECHNIQUE: Multidetector CT imaging of the abdomen and pelvis was performed using the standard protocol following bolus administration of intravenous contrast. CONTRAST:  189mL OMNIPAQUE IOHEXOL 300 MG/ML  SOLN COMPARISON:  None.  FINDINGS: Lower chest: No acute abnormality.  Coronary artery calcifications. Hepatobiliary: No solid liver abnormality is seen. No gallstones, gallbladder wall thickening, or biliary dilatation. Pancreas: Unremarkable. No pancreatic ductal dilatation or surrounding inflammatory changes. Spleen: Normal in size without significant abnormality. 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 Kidneys are normal, without renal calculi, solid lesion, or hydronephrosis. Bladder is unremarkable. Stomach/Bowel: Stomach is within normal limits. Appendix appears normal. The descending colon is decompressed, however there may be some wall thickening and adjacent inflammatory stranding (series 6, image 53). There is no evidence of bowel obstruction and there is scattered gas and stool present to the rectum. Vascular/Lymphatic:  Aortic atherosclerosis. No enlarged abdominal or pelvic lymph nodes. Reproductive: No mass or other significant abnormality. Other: No abdominal wall hernia or abnormality. No abdominopelvic ascites. Musculoskeletal: No acute or significant osseous findings. IMPRESSION: 1. The descending colon is decompressed, however there may be some wall thickening and adjacent inflammatory stranding suggestive of nonspecific infectious, inflammatory, or ischemic colitis. 2. There is no evidence of bowel obstruction and there is scattered gas and stool present to the rectum. 3. 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 This is nonspecific. Given size greater than 4 cm, recommend surgical referral with consideration of biopsy, resection, or metabolic characterization by PET-CT. 4. Coronary artery disease.  Aortic Atherosclerosis (ICD10-I70.0). These results will be called to the ordering clinician or representative by the Radiologist Assistant, and communication  documented in the PACS or [employer]. Electronically Signed   By: Eddie Candle M.D.   On: 01/22/2020 15:32    Orson Eva, DO  [facility]  If 7PM-7AM, please contact night-coverage [email or web address] Password [email or web address] 01/23/2020, 2:53 PM   LOS: 0 days

## 2020-01-23 NOTE — Care Management Obs Status (Signed)
[facility] NOTIFICATION   Patient Details  Name: Matthew Nguyen MRN: [id number] Date of Birth: 1970/07/10   Medicare Observation Status Notification Given:  Yes    Tommy Medal 01/23/2020, 4:10 PM

## 2020-01-23 NOTE — Op Note (Signed)
[facility] Patient Name: Matthew Nguyen Procedure Date: 01/23/2020 3:11 PM MRN: [id number] Date of Birth: January 31, 1971 Attending MD: [name] ,  CSN: [id number] Age: 49 Admit Type: Inpatient Procedure:                Upper GI endoscopy Indications:              Melena Providers:                [name], [place] Sharon Seller, RN, Raphael Gibney, Technician Referring MD:              Medicines:                Monitored Anesthesia Care Complications:            No immediate complications. Estimated Blood Loss:     Estimated blood loss: none. Procedure:                Pre-Anesthesia Assessment:                           - Prior to the procedure, a History and Physical                            was performed, and patient medications, allergies                            and sensitivities were reviewed. The patient's                            tolerance of previous anesthesia was reviewed.                           - The risks and benefits of the procedure and the                            sedation options and risks were discussed with the                            patient. All questions were answered and informed                            consent was obtained.                           - ASA Grade Assessment: II - A patient with mild                            systemic disease.                           After obtaining informed consent, the endoscope was                            passed under direct vision. Throughout the  procedure, the patient's blood pressure, pulse, and                            oxygen saturations were monitored continuously. The                            [id number]-H190 ([id number]) scope was introduced through the                            mouth, and advanced to the second part of duodenum.                            The upper GI endoscopy was accomplished without                             difficulty. The patient tolerated the procedure                            well. Scope In: 3:39:12 PM Scope Out: 3:45:19 PM Total Procedure Duration: 0 hours 6 minutes 7 seconds  Findings:      The Z-line was regular and was found 40 cm from the incisors.      The examined esophagus was normal.      Multiple localized small erosions with no bleeding and no stigmata of       recent bleeding were found in the gastric fundus and in the gastric       antrum. There was also erythema in the antrum.      One non-bleeding cratered duodenal ulcer with a flat pigmented spot       (Forrest Class IIc) was found in the first portion of the duodenum, at       the level of the duodenal sweep. The lesion was 12 mm in largest       dimension. Impression:               - Z-line regular, 40 cm from the incisors.                           - Normal esophagus.                           - Erosive gastropathy with no bleeding and no                            stigmata of recent bleeding.                           - Non-bleeding duodenal ulcer with a flat pigmented                            spot (Forrest Class IIc).                           - No specimens collected. Moderate Sedation:      Per Anesthesia Care Recommendation:           - Return patient to hospital ward for  ongoing care.                           - Full liquid diet today, can advance to solids                            tomorrow AM.                           - No ibuprofen, naproxen, or other non-steroidal                            anti-inflammatory drugs, including Goody powders.                           - Use Prilosec (omeprazole) 40 mg PO BID for 3                            months.                           - Follow up in GI clinic in 2-3 weeks.                           - Start Miralax 1 cap every day for chronic                            constipation.                           - Will require outpatient colonoscopy for screening                             purposes. Procedure Code(s):        --- Professional ---                           [phone], GC, Esophagogastroduodenoscopy, flexible,                            transoral; diagnostic, including collection of                            specimen(s) by brushing or washing, when performed                            (separate procedure) Diagnosis Code(s):        --- Professional ---                           K31.89, Other diseases of stomach and duodenum                           K26.9, Duodenal ulcer, unspecified as acute or                            chronic, without hemorrhage  or perforation                           K92.1, Melena (includes Hematochezia) CPT copyright 2019 American Medical Association. All rights reserved. The codes documented in this report are preliminary and upon coder review may  be revised to meet current compliance requirements. [name], MD [name],  01/23/2020 3:54:21 PM This report has been signed electronically. Number of Addenda: 0

## 2020-01-23 NOTE — Brief Op Note (Signed)
01/22/2020 - 01/23/2020  3:57 PM  PATIENT:  Matthew Nguyen  49 y.o. male  PRE-OPERATIVE DIAGNOSIS:  melena  POST-OPERATIVE DIAGNOSIS:  erosions @ pyloric channel; duodenal ulcer; gastric erosions;   PROCEDURE:  Procedure(s): ESOPHAGOGASTRODUODENOSCOPY (EGD) WITH PROPOFOL (N/A)  SURGEON:  Surgeon(s) and Role:    * Harvel Quale, MD - Primary  Performed EGD under propofol sedation today. Esophagus was normal. [place] had non bleeding ertythematous erosions in the fundus and the antrum (worse area). There was no presence of hematin. A [place] IIC ulcer was located in the duodenal sweep (1.2 cm) in diameter), no intervention was performed. No hematin was seen.  Recommendations: - Return patient to hospital ward for ongoing care. - Full liquid diet today, can advance to solids tomorrow AM. - No ibuprofen, naproxen, or other non-steroidal anti-inflammatory drugs, including Goody powders. - Switch to Prilosec (omeprazole) 40 mg PO BID for 3 months. - Follow up in GI clinic in 2-3 weeks. - Start Miralax 1 cap every day for chronic constipation. - Will require outpatient colonoscopy for screening purposes. - GI service will sign-off, please call us back if you have any more questions.  [name], MD Gastroenterology and Hepatology [facility]

## 2020-01-23 NOTE — Progress Notes (Signed)
Chaplain engaged in initial visit with Matthew Nguyen.  Matthew Nguyen shared the different health challenges that he has been facing.  He brought up his stool being black, the stomach pain he has had, and the headaches.  He also discussed the seizures he has had over the last couple of years and how that has changed his life and ability to be mobile, getting from place to place. He no longer has a license and ability to drive due to the seizures.  Chaplain acknowledged the different health challenges he has faced and offered the ministries of listening, support and prayer.  Chaplain is available to provide support as needed.     01/23/20 1100  Clinical Encounter Type  Visited With Patient  Visit Type Initial  Stress Factors  Patient Stress Factors Health changes

## 2020-01-23 NOTE — Anesthesia Postprocedure Evaluation (Signed)
Anesthesia Post Note  Patient: Matthew Nguyen  Procedure(s) Performed: ESOPHAGOGASTRODUODENOSCOPY (EGD) WITH PROPOFOL (N/A )  Patient location during evaluation: PACU Anesthesia Type: General Level of consciousness: awake and alert, oriented and sedated Pain management: pain level controlled Vital Signs Assessment: post-procedure vital signs reviewed and stable Respiratory status: spontaneous breathing, respiratory function stable and patient connected to nasal cannula oxygen Cardiovascular status: stable Postop Assessment: no apparent nausea or vomiting Anesthetic complications: no   No complications documented.   Last Vitals:  Vitals:   01/23/20 1456 01/23/20 1554  BP: (!) 134/95 107/86  Pulse: 97 89  Resp: 18 (!) 30  Temp: 36.8 C 36.8 C  SpO2: 97% 100%    Last Pain:  Vitals:   01/23/20 1554  TempSrc:   PainSc: Asleep                 Sawyer Mentzer C Verdis Koval

## 2020-01-23 NOTE — Transfer of Care (Signed)
Immediate Anesthesia Transfer of Care Note  Patient: Matthew Nguyen  Procedure(s) Performed: ESOPHAGOGASTRODUODENOSCOPY (EGD) WITH PROPOFOL (N/A )  Patient Location: PACU  Anesthesia Type:General  Level of Consciousness: awake, alert , oriented and sedated  Airway & Oxygen Therapy: Patient Spontanous Breathing and Patient connected to nasal cannula oxygen  Post-op Assessment: Report given to RN and Post -op Vital signs reviewed and stable  Post vital signs: Reviewed and stable  Last Vitals:  Vitals Value Taken Time  BP 107/86 01/23/20 1554  Temp 98.3   Pulse 89 01/23/20 1557  Resp 27 01/23/20 1557  SpO2 99 % 01/23/20 1557  Vitals shown include unvalidated device data.  Last Pain:  Vitals:   01/23/20 1532  TempSrc:   PainSc: 6          Complications: No complications documented.

## 2020-01-23 NOTE — Consult Note (Signed)
@LOGO @   Referring Provider: [facility] Primary Care Physician:  Abran Richard, MD Primary Gastroenterologist:  Dr. Jenetta Downer   Date of Admission: 01/22/20 Date of Consultation: 01/23/20  Reason for Consultation: Melena  HPI:  Matthew Nguyen is a 49 y.o. year old male with history of GERD, migraines, seizures who presented to the emergency room 01/22/2020 with 3 days of generalized abdominal pain, nausea, and vomiting superimposed on 14-day history of constipation.  He has been taking laxatives with little relief.  Had BM the morning before he presented to the emergency room that was "jet black" with a small amount of bright red blood.  Patient reported history of upper and lower endoscopies with ulcers and internal hemorrhoids.  Has been followed by gastroenterologist in [place], [place].  Takes Tums, no PPI.  Has been taking Goody powders recently for mild headaches.  Labs in the ED remarkable for WBC 16.8 (H), hemoglobin 12.7 (L), BUN 36 (H), sodium 133 (L), FOBT positive, lipase within normal limits.  CT A/P revealing decompressed descending colon however there may be some wall thickening and adjacent inflammatory stranding suggestive of nonspecific infectious, inflammatory, or ischemic colitis.  No evidence of bowel obstruction.  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  He is started on IV Protonix every 24 hours, IV fluids, clear liquids. Dr. Abbey Chatters was consulted who recommended NPO at midnight with EGD in the am.   Hemoglobin declined to 10.8 yesterday but is back up to 11.4 today.  Today:  Patient was interviewed with Dr. Jenetta Downer.  Chronic history of intermittent constipation taking laxative/stool softeners as needed. He has been struggling with constipation for the last 2 weeks and also began to have some generalized abdominal pain and  nausea/vomiting on Friday. Reports vomiting after all p.o. intake. No hematemesis. Went to [employer] to pick up laxative from [employer] (unclear what type) which allowed him to have a bowel movement Sunday morning. This bowel movement was black but he also noticed red blood on toilet tissue. He has had 3-4 episodes total of melanotic stool with some red blood as well. Last episode of melena/rectal bleeding was last night.  No additional nausea/vomiting since yesterday. Admits to taking Goody powders daily for headaches. Also reports history of Barrett's esophagus and gastric ulcers that was noted on prior upper endoscopy in [place], [place] 1-2 years ago. He was prescribed PPI and Carafate but has not continued on PPI. Also states they found "something" in my colon.  He continues with mild abdominal pain across the mid to lower abdomen today. Mild LLQ tenderness palpation.  No alcohol in the last 3 months. Used to drink heavily. He does smoke marijuana intermittently. No history of IV or intranasal drug use. No family history of colon cancer or other GI cancers.  Past Medical History:  Diagnosis Date  . GERD (gastroesophageal reflux disease)   . Migraines   . Seizure [facility])     Past Surgical History:  Procedure Laterality Date  . UMBILICAL HERNIA REPAIR N/A 06/06/2018   Procedure: HERNIA REPAIR UMBILICAL ADULT WITH MESH;  Surgeon: Aviva Signs, MD;  Location: [facility];  Service: General;  Laterality: N/A;    Prior to Admission medications   Medication Sig Start Date End Date Taking? Authorizing Provider  amitriptyline (ELAVIL) 50 MG tablet TAKE 3 TABLETS (150 MG TOTAL) BY MOUTH AT BEDTIME. 12/07/19  Yes Cameron Sprang, MD  divalproex (  DEPAKOTE) 500 MG DR tablet Take 2 tablets in the morning and two tablets at night. 06/06/19  Yes Cameron Sprang, MD  Galcanezumab-gnlm [facility]) 120 MG/ML SOAJ Inject 1 pen into the skin every 30 (thirty) days. 06/06/19  Yes Cameron Sprang, MD   Oxcarbazepine (TRILEPTAL) 300 MG tablet Take 2 tablets twice a day 06/06/19  Yes Cameron Sprang, MD  pantoprazole (PROTONIX) 40 MG tablet Take 40 mg by mouth daily.   Yes [provider]  SUMAtriptan (IMITREX) 50 MG tablet Take 1 tablet (50 mg total) by mouth every 2 (two) hours as needed for migraine or headache. May repeat in 2 hours if headache persists or recurs. 06/06/19  Yes Cameron Sprang, MD  zonisamide (ZONEGRAN) 100 MG capsule TAKE 4 CAPSULES AT BEDTIME. 08/15/19  Yes Cameron Sprang, MD  dicyclomine (BENTYL) 20 MG tablet Take 20 mg by mouth 3 (three) times daily as needed. Patient not taking: Reported on 01/22/2020 12/07/18   [provider]  sucralfate (CARAFATE) 1 GM/10ML suspension Take 1 g by mouth 4 (four) times daily. Patient not taking: Reported on 01/22/2020 04/04/19   [provider]  zolpidem (AMBIEN) 5 MG tablet Take 5 mg by mouth at bedtime. 08/12/15 06/06/19  [provider]    Current Facility-Administered Medications  Medication Dose Route Frequency Provider Last Rate Last Admin  . 0.9 %  sodium chloride infusion  250 mL Intravenous PRN Roney Jaffe, MD      . acetaminophen (TYLENOL) tablet 650 mg  650 mg Oral Q6H PRN Roney Jaffe, MD   650 mg at 01/22/20 2241   Or  . acetaminophen (TYLENOL) suppository 650 mg  650 mg Rectal Q6H PRN Roney Jaffe, MD   650 mg at 01/23/20 1021  . ALPRAZolam Duanne Moron) tablet 0.25 mg  0.25 mg Oral TID PRN Roney Jaffe, MD   0.25 mg at 01/23/20 1352  . amitriptyline (ELAVIL) tablet 150 mg  150 mg Oral QHS Roney Jaffe, MD   150 mg at 01/22/20 2242  . divalproex (DEPAKOTE ER) 24 hr tablet 2,000 mg  2,000 mg Oral QHS Roney Jaffe, MD   2,000 mg at 01/22/20 2242  . HYDROmorphone (DILAUDID) injection 0.5 mg  0.5 mg Intravenous Q4H PRN Tat, Shanon Brow, MD   0.5 mg at 01/23/20 1359  . ondansetron (ZOFRAN) tablet 4 mg  4 mg Oral Q6H PRN Roney Jaffe, MD       Or  . ondansetron [facility]) injection 4 mg   4 mg Intravenous Q6H PRN Roney Jaffe, MD   4 mg at 01/23/20 1351  . Oxcarbazepine (TRILEPTAL) tablet 1,200 mg  1,200 mg Oral QHS Roney Jaffe, MD   1,200 mg at 01/22/20 2241  . pantoprazole (PROTONIX) injection 40 mg  40 mg Intravenous Q12H Tat, David, MD      . sodium chloride flush (NS) 0.9 % injection 3 mL  3 mL Intravenous Q12H Roney Jaffe, MD   3 mL at 01/23/20 1352  . sodium chloride flush (NS) 0.9 % injection 3 mL  3 mL Intravenous PRN Roney Jaffe, MD      . zonisamide (ZONEGRAN) capsule 400 mg  400 mg Oral QHS Roney Jaffe, MD   400 mg at 01/22/20 2242    Allergies as of 01/22/2020 - Review Complete 01/22/2020  Allergen Reaction Noted  . Morphine Nausea Only 08/31/2015    Family History  Problem Relation Age of Onset  . Heart disease Father     Social History  Socioeconomic History  . Marital status: Single    Spouse name: Not on file  . Number of children: 0  . Years of education: Not on file  . Highest education level: Not on file  Occupational History  . Not on file  Tobacco Use  . Smoking status: Current Every Day Smoker  . Smokeless tobacco: Never Used  Vaping Use  . Vaping Use: Never used  Substance and Sexual Activity  . Alcohol use: Yes  . Drug use: Yes    Types: Marijuana  . Sexual activity: Yes    Partners: Female  Other Topics Concern  . Not on file  Social History Narrative   Pt lives alone in 1 story home   No children   12th grade education   Past work - Ship broker / now on disability   Social Determinants of Radio broadcast assistant Strain:   . Difficulty of Paying Living Expenses: Not on file  Food Insecurity:   . Worried About Charity fundraiser in the Last Year: Not on file  . Ran Out of Food in the Last Year: Not on file  Transportation Needs:   . Lack of Transportation (Medical): Not on file  . Lack of Transportation (Non-Medical): Not on file  Physical Activity:   . Days of Exercise per Week:  Not on file  . Minutes of Exercise per Session: Not on file  Stress:   . Feeling of Stress : Not on file  Social Connections:   . Frequency of Communication with Friends and Family: Not on file  . Frequency of Social Gatherings with Friends and Family: Not on file  . Attends Religious Services: Not on file  . Active Member of Clubs or Organizations: Not on file  . Attends Archivist Meetings: Not on file  . Marital Status: Not on file  Intimate Partner Violence:   . Fear of Current or Ex-Partner: Not on file  . Emotionally Abused: Not on file  . Physically Abused: Not on file  . Sexually Abused: Not on file    Review of Systems: Gen: Denies fever, chills, lightheadedness, dizziness.  CV: Denies chest pain or heart palpitations Resp: Denies shortness of breath or cough GI: See HPI Heme: See HPI  Physical Exam: Vital signs in last 24 hours: Temp:  [98.7 F (37.1 C)-99.9 F (37.7 C)] 98.7 F (37.1 C) (10/12 1211) Pulse Rate:  [100-116] 100 (10/12 1211) Resp:  [16-20] 18 (10/12 1211) BP: (122-164)/(93-115) 122/93 (10/12 1211) SpO2:  [98 %-100 %] 98 % (10/12 0847) Weight:  [70.5 kg] 70.5 kg (10/12 0450) Last BM Date: 01/22/20 General:   Alert,  Well-developed, well-nourished, pleasant and cooperative in NAD Head:  Normocephalic and atraumatic. Eyes:  Sclera clear, no icterus.   Conjunctiva pink. Lungs:  Clear throughout to auscultation.   No wheezes, crackles, or rhonchi. No acute distress. Heart:  Regular rate and rhythm; no murmurs, clicks, rubs,  or gallops. Abdomen:  Soft, and nondistended. Mild TTP in LLQ. No masses, hepatosplenomegaly or hernias noted. Normal bowel sounds, without guarding, and without rebound.   Rectal: Some red blood at anal opening. Melanotic stool on DRE. Msk:  Symmetrical without gross deformities. Normal posture. Extremities:  Without edema. Neurologic:  Alert and  oriented x4;  grossly normal neurologically. Skin:  Intact without  significant lesions or rashes. Psych: Normal mood and affect.  Intake/Output from previous day: 10/11 0701 - 10/12 0700 In: 1000 [IV Piggyback:1000] Out: 1 [Urine:1] Intake/Output  this shift: No intake/output data recorded.  Lab Results: Recent Labs    01/22/20 1158 01/22/20 2147 01/23/20 0458  WBC 16.8* 10.9* 11.5*  HGB 12.7* 10.8* 11.4*  HCT 37.9* 31.6* 33.7*  PLT 335 298 311   BMET Recent Labs    01/22/20 1158  NA 133*  K 3.6  CL 99  CO2 22  GLUCOSE 108*  BUN 36*  CREATININE 0.94  CALCIUM 9.2   LFT Recent Labs    01/22/20 1158  PROT 6.8  ALBUMIN 3.7  AST 15  ALT 13  ALKPHOS 47  BILITOT 0.5   PT/INR Recent Labs    01/23/20 0458  LABPROT 12.3  INR 1.0   Studies/Results: CT ABDOMEN PELVIS W CONTRAST  Result Date: 01/22/2020 CLINICAL DATA:  Bowel obstruction suspected EXAM: CT ABDOMEN AND PELVIS WITH CONTRAST TECHNIQUE: Multidetector CT imaging of the abdomen and pelvis was performed using the standard protocol following bolus administration of intravenous contrast. CONTRAST:  112mL OMNIPAQUE IOHEXOL 300 MG/ML  SOLN COMPARISON:  None. FINDINGS: Lower chest: No acute abnormality.  Coronary artery calcifications. Hepatobiliary: No solid liver abnormality is seen. No gallstones, gallbladder wall thickening, or biliary dilatation. Pancreas: Unremarkable. No pancreatic ductal dilatation or surrounding inflammatory changes. Spleen: Normal in size without significant abnormality. 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 Kidneys are normal, without renal calculi, solid lesion, or hydronephrosis. Bladder is unremarkable. Stomach/Bowel: Stomach is within normal limits. Appendix appears normal. The descending colon is decompressed, however there may be some wall thickening and adjacent inflammatory stranding (series 6, image 53). There is no evidence of bowel obstruction  and there is scattered gas and stool present to the rectum. Vascular/Lymphatic: Aortic atherosclerosis. No enlarged abdominal or pelvic lymph nodes. Reproductive: No mass or other significant abnormality. Other: No abdominal wall hernia or abnormality. No abdominopelvic ascites. Musculoskeletal: No acute or significant osseous findings. IMPRESSION: 1. The descending colon is decompressed, however there may be some wall thickening and adjacent inflammatory stranding suggestive of nonspecific infectious, inflammatory, or ischemic colitis. 2. There is no evidence of bowel obstruction and there is scattered gas and stool present to the rectum. 3. 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 This is nonspecific. Given size greater than 4 cm, recommend surgical referral with consideration of biopsy, resection, or metabolic characterization by PET-CT. 4. Coronary artery disease.  Aortic Atherosclerosis (ICD10-I70.0). These results will be called to the ordering clinician or representative by the Radiologist Assistant, and communication documented in the PACS or [employer]. Electronically Signed   By: Eddie Candle M.D.   On: 01/22/2020 15:32    Impression: 49 year old male with history of GERD, migraines, seizures, constipation, and per patient Barrett's esophagus and gastric ulcers a couple years ago diagnosed via EGD in [place], [place] who presented to the emergency room 01/22/20 with 3 days of abdominal pain, nausea, vomiting with new onset of melena on 10/10. This is superimposed on 2-week history of constipation.  Hemoglobin 12.7 on admission, down to 10.8 yesterday but back up to 11.4 today. FOBT positive. BUN elevated at 36, creatinine within normal limits. CT A/P with contrast with no acute upper GI findings, he did have decompressed descending colon with possible wall thickening and adjacent inflammatory stranding suggestive of nonspecific  infectious, inflammatory, or ischemic colitis.  Melena: Last episode of melena was yesterday. Hemoglobin is low but stable  today. Rectal exam today with melanotic appearing stool. Some red blood at anal opening. Patient admits to taking Goody powders daily for headaches. Reports history of peptic ulcer disease and Barrett's esophagus but has not maintained on PPI.    Concern for PUD in the setting of daily NSAIDs. May also have gastritis, duodenitis, esophagitis, and cannot rule out Dieulafoy's lesion. Plans for EGD today. He is not on any blood thinners and has been NPO since midnight. Currently on PPI q24 hours, will need to increase this to BID.   Bowel wall thickening: Under distention versus possible bowel wall thickening. It is possible he has inflammation in his colon secondary to chronic NSAID use. He does not have any diarrhea to suggest infectious colitis. WBC elevated on admission but improving, possibly secondary to dehydration. No significant hematochezia although he does note some red blood when having melena. Not sure if this is maroon or bright red. Patient reports history of colonoscopy in [place], [place] 1-2 years ago and states they found "something" in my colon. On rectal exam, he did have some red blood at the anal opening.  Abdominal exam with very mild tenderness palpation in the left lower quadrant.    Discussed with Dr. Jenetta Downer, no need for antibiotics right now. Plan to continue to monitor for ongoing pain. Dr. Jenetta Downer will review images himself as well. Will try to request prior GI records.   Constipation: Chronic. Will need to be on a better bowel regimen in the outpatient setting.  Plan: Proceed with EGD with propofol with Dr. Jenetta Downer today.  Increase Protonix to 40 mg IV twice daily. Keep NPO. Monitor H/H and for ongoing GI bleeding. Monitor for worsening or persistent abdominal pain. Per Dr. Jenetta Downer, he will review CT images.  Will need colonoscopy likely as an  outpatient to follow-up on bowel thickening noted on CT. We will try to request prior GI records.    LOS: 0 days    01/23/2020, 2:49 PM   Aliene Altes, [facility]

## 2020-01-24 DIAGNOSIS — R569 Unspecified convulsions: Secondary | ICD-10-CM | POA: Diagnosis not present

## 2020-01-24 DIAGNOSIS — R519 Headache, unspecified: Secondary | ICD-10-CM | POA: Diagnosis not present

## 2020-01-24 DIAGNOSIS — K922 Gastrointestinal hemorrhage, unspecified: Secondary | ICD-10-CM

## 2020-01-24 DIAGNOSIS — K921 Melena: Secondary | ICD-10-CM | POA: Diagnosis not present

## 2020-01-24 MED ORDER — SUCRALFATE 1 GM/10ML PO SUSP: 1.0000 g | Freq: Four times a day (QID) | ORAL | 0 refills | Status: DC

## 2020-01-24 MED ORDER — PANTOPRAZOLE SODIUM 40 MG PO TBEC
40.0000 mg | DELAYED_RELEASE_TABLET | Freq: Two times a day (BID) | ORAL | 2 refills | Status: DC
Start: 2020-01-24 — End: 2020-03-27

## 2020-01-24 MED ORDER — POLYETHYLENE GLYCOL 3350 17 G PO PACK: 17.0000 g | PACK | Freq: Every day | ORAL | 0 refills | Status: AC

## 2020-01-24 NOTE — Discharge Instructions (Signed)
Upper Gastrointestinal Bleeding  Upper gastrointestinal (GI) bleeding is bleeding from the swallowing tube (esophagus), stomach, or the first part of the small intestine (duodenum). If you have upper GI bleeding, you may vomit blood or have bloody or black stools. Bleeding can range from mild to serious or even life-threatening. If there is a lot of bleeding, you may need to stay in the hospital. What are the causes? This condition may be caused by:  Ulcer disease of the stomach (peptic ulcer) or duodenum. This is the most common cause of GI bleeding.  Inflammation, irritation, or swelling of the esophagus (esophagitis).  A tear in the esophagus.  Cancer of the esophagus, stomach, or duodenum.  An abnormal or weakened blood vessel in one of the upper GI structures.  A bleeding disorder that impairs the formation of blood clots and causes easy bleeding (coagulopathy). What increases the risk? The following factors may make you more likely to develop this condition:  Being older than 50 years of age.  Being male.  Having another long-term disease, especially liver or kidney disease.  Having a stomach infection caused by Helicobacter pylori bacteria.  Having frequent or severe vomiting.  Abusing alcohol.  Taking certain medicines for a long time, such as: ? NSAIDs. ? Anticoagulants. What are the signs or symptoms? Symptoms of this condition include:  Vomiting blood.  Black or maroon-colored stools.  Bloody stools.  Weakness or dizziness.  Heartburn.  Abdominal pain.  Difficulty swallowing.  Weight loss.  Yellow eyes or skin (jaundice).  Racing heartbeat. How is this diagnosed? This condition may be diagnosed based on:  Your symptoms and medical history.  A physical exam. During the exam, your health care provider will check for signs of blood loss, such as low blood pressure and a rapid pulse.  Tests, such as: ? Blood tests to measure your blood cell  count and to check for other signs of blood loss and clotting ability. ? Blood tests to check your liver and kidney function. ? A chest X-ray to look for a tear in the esophagus. ? Endoscopy. In this procedure, a flexible scope is put down your esophagus and into your stomach or duodenum to look for the source of bleeding. ? Angiogram. This may be done if the source of bleeding is not found during endoscopy. For an angiogram, X-rays are taken after a dye is injected into your bloodstream. ? Nasogastric tube insertion. This is a tube passed through your nose and down into your stomach. It may be connected to a source of gentle suction to see if any blood comes out. How is this treated? Treatment for this condition depends on the cause of the bleeding. Active bleeding is treated at the hospital. Treatment may include:  Getting fluids through an IV tube inserted into one of your veins.  Getting blood through an IV tube (blood transfusion).  Getting high doses of medicine through the IV to lower stomach acid. This may be done to treat ulcer disease.  Having endoscopy to treat an area of bleeding with high heat (coagulation), injections, or surgical clips.  Having a procedure that involves first doing an angiogram and then blocking blood flow to the bleeding site (embolization).  Stopping or changing some of your regular medicines for a certain amount of time.  Having other surgical procedures if initial treatments do not control bleeding. Follow these instructions at home:  Take over-the-counter and prescription medicines only as told by your health care provider. You may need  to avoid NSAIDs or other medicines that increase bleeding.  Do not drink alcohol.  Drink enough fluid to keep your urine clear or pale yellow.  Follow instructions from your health care provider about eating or drinking restrictions.  Return to your normal activities as told by your health care provider. Ask your  health care provider what activities are safe for you.  Do not use any tobacco products, such as cigarettes, chewing tobacco, and e-cigarettes. If you need help quitting, ask your health care provider.  Keep all follow-up visits as told by your health care provider. This is important. Contact a health care provider if:  You have abdominal pain or heartburn.  You have unexplained weight loss.  You have trouble swallowing.  You have frequent vomiting.  You develop jaundice.  You feel weak or dizzy.  You need help to stop smoking or drinking alcohol. Get help right away if:  You have vomiting with blood.  You have blood in your stools.  You have severe cramps in your back or abdomen.  Your symptoms of upper GI bleeding come back after treatment. This information is not intended to replace advice given to you by your health care provider. Make sure you discuss any questions you have with your health care provider. Document Revised: 03/12/2017 Document Reviewed: 08/15/2015 Elsevier Patient Education  [place].

## 2020-01-24 NOTE — Discharge Summary (Signed)
Physician Discharge Summary  Sundance Moise VVO:[id number] DOB: 1970-08-14 DOA: 01/22/2020  PCP: Abran Richard, MD  Admit date: 01/22/2020 Discharge date: 01/24/2020  Admitted From: home Disposition:  home  Recommendations for Outpatient Follow-up:  1. Follow-up with gastroenterology as outpatient 2. Patient will be colonoscopy to further evaluate thickening in distal colon 3. Outpatient work-up for adrenal mass, patient is aware of this.  Will likely need PET scan versus referral to surgery.   Discharge Condition: Stable CODE STATUS: Full code Diet recommendation: Heart healthy  Brief/Interim Summary: 49 year old male with a history of seizure disorder, GERD, migraine headache presenting with 3-day history of nausea, vomiting, and abdominal pain.  The patient has had some constipation for the past 2 weeks.  Has been taking some over-the-counter laxatives with little relief.  On the morning of 01/22/2020, the patient had a bowel movement that he described as "jet black" with a small amount of hematochezia.  The patient states that he takes Goody's at least 20 times a week for chronic daily headache.  He also takes Tums.  He denies any fevers, chills, chest pain, shortness breath, hematemesis.  There is no dysuria or hematuria. No alcohol in the last 3 months. Used to drink heavily. He does smoke marijuana intermittently. No history of IV or intranasal drug use.  Also reports history of Barrett's esophagus and gastric ulcers that was noted on prior upper endoscopy in [place], [place] 1-2 years ago. He was prescribed PPI and Carafate but has not continued on PPI. In the emergency department, the patient was afebrile hemodynamically stable with oxygen saturation 100% room air.  Guaiac was positive.  GI was consulted to assist with management.  Discharge Diagnoses:  Principal Problem:   Melena Active Problems:   Chronic daily headache   Seizures (HCC)   Abdominal pain   Nausea & vomiting    Guaiac + stool   Constipation   Abnormal CT scan, colon  Chronic blood loss anemia/melena -Hemoglobin 14.6 on 12/25/2018 -Presented with hemoglobin 12.7, which remained stable -Suspect peptic ulcer disease secondary to NSAID use -GI consulted-->  patient underwent EGD that showed normal esophagus, nonbleeding erythematous erosions in the fundus and antrum as well as duodenal ulcer.  No active bleeding was noted at the time. -Continue Protonix--> increase to twice daily -Carafate added to her regimen -Diet was advanced soft diet without difficulty -01/22/2020 CT abdomen/pelvis--questionable wall thickening of the descending colon with inflammatory changes; right adrenal soft tissue mass -Plans are for outpatient colonoscopy  Seizure disorder -Continue home dose of Depakote, Trileptal, Zonegran -Patient follows with neurology, Dr. 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 -Discussed with the patient--he is agreeable to outpatient work-up  Hyponatremia -This appears to be chronic dating back to July 2020 although the patient may have a degree of volume depletion -Treated with IV fluids  Chronic migraines -Continue follow-up with neurology -Continue Imitrex as needed  Discharge Instructions  Discharge Instructions    Diet - low sodium heart healthy   Complete by: As directed    Increase activity slowly   Complete by: As directed      Allergies as of 01/24/2020      Reactions   Morphine Nausea Only      Medication List    TAKE these medications   amitriptyline 50 MG tablet Commonly known as: ELAVIL TAKE 3 TABLETS (150 MG TOTAL) BY MOUTH AT BEDTIME.   dicyclomine 20 MG tablet Commonly known as: BENTYL Take 20 mg  by mouth 3 (three) times daily as needed.   divalproex 500 MG DR tablet Commonly known as: DEPAKOTE Take 2 tablets in the morning and two tablets at night.   Emgality 120 MG/ML Soaj Generic drug:  Galcanezumab-gnlm Inject 1 pen into the skin every 30 (thirty) days.   Oxcarbazepine 300 MG tablet Commonly known as: TRILEPTAL Take 2 tablets twice a day   pantoprazole 40 MG tablet Commonly known as: PROTONIX Take 1 tablet (40 mg total) by mouth 2 (two) times daily before a meal. What changed: when to take this   polyethylene glycol 17 g packet Commonly known as: MIRALAX / GLYCOLAX Take 17 g by mouth daily. Start taking on: January 25, 2020   sucralfate 1 GM/10ML suspension Commonly known as: CARAFATE Take 10 mLs (1 g total) by mouth 4 (four) times daily.   SUMAtriptan 50 MG tablet Commonly known as: IMITREX Take 1 tablet (50 mg total) by mouth every 2 (two) hours as needed for migraine or headache. May repeat in 2 hours if headache persists or recurs.   zolpidem 5 MG tablet Commonly known as: AMBIEN Take 5 mg by mouth at bedtime.   zonisamide 100 MG capsule Commonly known as: ZONEGRAN TAKE 4 CAPSULES AT BEDTIME.       Follow-up Information    Harvel Quale, MD Follow up.   Specialty: Gastroenterology Why: office will call you for follow up appointment Contact information: [address] [place] [address] [phone]              Allergies  Allergen Reactions  . Morphine Nausea Only    Consultations:  Gastroenterology   Procedures/Studies: CT ABDOMEN PELVIS W CONTRAST  Result Date: 01/22/2020 CLINICAL DATA:  Bowel obstruction suspected EXAM: CT ABDOMEN AND PELVIS WITH CONTRAST TECHNIQUE: Multidetector CT imaging of the abdomen and pelvis was performed using the standard protocol following bolus administration of intravenous contrast. CONTRAST:  121mL OMNIPAQUE IOHEXOL 300 MG/ML  SOLN COMPARISON:  None. FINDINGS: Lower chest: No acute abnormality.  Coronary artery calcifications. Hepatobiliary: No solid liver abnormality is seen. No gallstones, gallbladder wall thickening, or biliary dilatation. Pancreas: Unremarkable. No  pancreatic ductal dilatation or surrounding inflammatory changes. Spleen: Normal in size without significant abnormality. 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 Kidneys are normal, without renal calculi, solid lesion, or hydronephrosis. Bladder is unremarkable. Stomach/Bowel: Stomach is within normal limits. Appendix appears normal. The descending colon is decompressed, however there may be some wall thickening and adjacent inflammatory stranding (series 6, image 53). There is no evidence of bowel obstruction and there is scattered gas and stool present to the rectum. Vascular/Lymphatic: Aortic atherosclerosis. No enlarged abdominal or pelvic lymph nodes. Reproductive: No mass or other significant abnormality. Other: No abdominal wall hernia or abnormality. No abdominopelvic ascites. Musculoskeletal: No acute or significant osseous findings. IMPRESSION: 1. The descending colon is decompressed, however there may be some wall thickening and adjacent inflammatory stranding suggestive of nonspecific infectious, inflammatory, or ischemic colitis. 2. There is no evidence of bowel obstruction and there is scattered gas and stool present to the rectum. 3. 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 This is nonspecific. Given size greater than 4 cm, recommend surgical referral with consideration of biopsy, resection, or metabolic characterization by PET-CT. 4. Coronary artery disease.  Aortic Atherosclerosis (ICD10-I70.0). These results will be  called to the ordering clinician or representative by the Radiologist Assistant, and communication documented in the PACS or [employer]. Electronically Signed   By: Eddie Candle M.D.   On: 01/22/2020 15:32       Subjective: Tolerating diet.  No further vomiting.  Discharge Exam: Vitals:   01/23/20 2110  01/23/20 2200 01/24/20 0500 01/24/20 0600  BP:  125/88  (!) 134/92  Pulse:    (!) 105  Resp:  16  16  Temp:  98.8 F (37.1 C)  98.4 F (36.9 C)  TempSrc:  Oral  Oral  SpO2: 100% 100%  100%  Weight:   71.5 kg   Height:        General: Pt is alert, awake, not in acute distress Cardiovascular: RRR, S1/S2 +, no rubs, no gallops Respiratory: CTA bilaterally, no wheezing, no rhonchi Abdominal: Soft, NT, ND, bowel sounds + Extremities: no edema, no cyanosis    The results of significant diagnostics from this hospitalization (including imaging, microbiology, ancillary and laboratory) are listed below for reference.     Microbiology: Recent Results (from the past 240 hour(s))  Resp Panel by RT PCR (RSV, Flu A&B, Covid) - Nasopharyngeal Swab     Status: None   Collection Time: 01/22/20  7:31 PM   Specimen: Nasopharyngeal Swab  Result Value Ref Range Status   SARS Coronavirus 2 by RT PCR NEGATIVE NEGATIVE Final    Comment: (NOTE) SARS-CoV-2 target nucleic acids are NOT DETECTED.  The SARS-CoV-2 RNA is generally detectable in upper respiratoy specimens during the acute phase of infection. The lowest concentration of SARS-CoV-2 viral copies this assay can detect is 131 copies/mL. A negative result does not preclude SARS-Cov-2 infection and should not be used as the sole basis for treatment or other patient management decisions. A negative result may occur with  improper specimen collection/handling, submission of specimen other than nasopharyngeal swab, presence of viral mutation(s) within the areas targeted by this assay, and inadequate number of viral copies (<131 copies/mL). A negative result must be combined with clinical observations, patient history, and epidemiological information. The expected result is Negative.  Fact Sheet for Patients:  PinkCheek.be  Fact Sheet for Healthcare Providers:  GravelBags.it  This  test is no t yet approved or cleared by the [place] FDA and  has been authorized for detection and/or diagnosis of SARS-CoV-2 by FDA under an Emergency Use Authorization (EUA). This EUA will remain  in effect (meaning this test can be used) for the duration of the COVID-19 declaration under Section 564(b)(1) of the Act, 21 U.S.C. section 360bbb-3(b)(1), unless the authorization is terminated or revoked sooner.     Influenza A by PCR NEGATIVE NEGATIVE Final   Influenza B by PCR NEGATIVE NEGATIVE Final    Comment: (NOTE) The Xpert Xpress SARS-CoV-2/FLU/RSV assay is intended as an aid in  the diagnosis of influenza from Nasopharyngeal swab specimens and  should not be used as a sole basis for treatment. Nasal washings and  aspirates are unacceptable for Xpert Xpress SARS-CoV-2/FLU/RSV  testing.  Fact Sheet for Patients: PinkCheek.be  Fact Sheet for Healthcare Providers: GravelBags.it  This test is not yet approved or cleared by the [place] FDA and  has been authorized for detection and/or diagnosis of SARS-CoV-2 by  FDA under an Emergency Use Authorization (EUA). This EUA will remain  in effect (meaning this test can be used) for the duration of the  Covid-19 declaration under Section 564(b)(1) of the Act, 21  U.S.C. section 360bbb-3(b)(1),  unless the authorization is  terminated or revoked.    Respiratory Syncytial Virus by PCR NEGATIVE NEGATIVE Final    Comment: (NOTE) Fact Sheet for Patients: PinkCheek.be  Fact Sheet for Healthcare Providers: GravelBags.it  This test is not yet approved or cleared by the [place] FDA and  has been authorized for detection and/or diagnosis of SARS-CoV-2 by  FDA under an Emergency Use Authorization (EUA). This EUA will remain  in effect (meaning this test can be used) for the duration of the  COVID-19 declaration  under Section 564(b)(1) of the Act, 21 U.S.C.  section 360bbb-3(b)(1), unless the authorization is terminated or  revoked. Performed at [facility], [address], [place], [place] [address]      Labs: BNP (last 3 results) No results for input(s): BNP in the last 8760 hours. Basic Metabolic Panel: Recent Labs  Lab 01/22/20 1158  NA 133*  K 3.6  CL 99  CO2 22  GLUCOSE 108*  BUN 36*  CREATININE 0.94  CALCIUM 9.2   Liver Function Tests: Recent Labs  Lab 01/22/20 1158  AST 15  ALT 13  ALKPHOS 47  BILITOT 0.5  PROT 6.8  ALBUMIN 3.7   Recent Labs  Lab 01/22/20 1158  LIPASE 24   No results for input(s): AMMONIA in the last 168 hours. CBC: Recent Labs  Lab 01/22/20 1158 01/22/20 2147 01/23/20 0458  WBC 16.8* 10.9* 11.5*  NEUTROABS 12.2*  --   --   HGB 12.7* 10.8* 11.4*  HCT 37.9* 31.6* 33.7*  MCV 98.2 96.9 98.3  PLT 335 298 311   Cardiac Enzymes: No results for input(s): CKTOTAL, CKMB, CKMBINDEX, TROPONINI in the last 168 hours. BNP: Invalid input(s): POCBNP CBG: No results for input(s): GLUCAP in the last 168 hours. D-Dimer No results for input(s): DDIMER in the last 72 hours. Hgb A1c No results for input(s): HGBA1C in the last 72 hours. Lipid Profile No results for input(s): CHOL, HDL, LDLCALC, TRIG, CHOLHDL, LDLDIRECT in the last 72 hours. Thyroid function studies No results for input(s): TSH, T4TOTAL, T3FREE, THYROIDAB in the last 72 hours.  Invalid input(s): FREET3 Anemia work up No results for input(s): VITAMINB12, FOLATE, FERRITIN, TIBC, IRON, RETICCTPCT in the last 72 hours. Urinalysis    Component Value Date/Time   COLORURINE STRAW (A) 10/24/2018 0847   APPEARANCEUR CLEAR 10/24/2018 0847   APPEARANCEUR Clear 06/06/2011 1219   LABSPEC 1.005 10/24/2018 0847   LABSPEC 1.009 06/06/2011 1219   PHURINE 7.0 10/24/2018 0847   GLUCOSEU NEGATIVE 10/24/2018 0847   GLUCOSEU Negative 06/06/2011 1219   HGBUR NEGATIVE 10/24/2018 0847   BILIRUBINUR  NEGATIVE 10/24/2018 0847   BILIRUBINUR Negative 06/06/2011 1219   KETONESUR NEGATIVE 10/24/2018 0847   PROTEINUR NEGATIVE 10/24/2018 0847   NITRITE NEGATIVE 10/24/2018 0847   LEUKOCYTESUR NEGATIVE 10/24/2018 0847   LEUKOCYTESUR Negative 06/06/2011 1219   Sepsis Labs Invalid input(s): PROCALCITONIN,  WBC,  LACTICIDVEN Microbiology Recent Results (from the past 240 hour(s))  Resp Panel by RT PCR (RSV, Flu A&B, Covid) - Nasopharyngeal Swab     Status: None   Collection Time: 01/22/20  7:31 PM   Specimen: Nasopharyngeal Swab  Result Value Ref Range Status   SARS Coronavirus 2 by RT PCR NEGATIVE NEGATIVE Final    Comment: (NOTE) SARS-CoV-2 target nucleic acids are NOT DETECTED.  The SARS-CoV-2 RNA is generally detectable in upper respiratoy specimens during the acute phase of infection. The lowest concentration of SARS-CoV-2 viral copies this assay can detect is 131 copies/mL. A negative  result does not preclude SARS-Cov-2 infection and should not be used as the sole basis for treatment or other patient management decisions. A negative result may occur with  improper specimen collection/handling, submission of specimen other than nasopharyngeal swab, presence of viral mutation(s) within the areas targeted by this assay, and inadequate number of viral copies (<131 copies/mL). A negative result must be combined with clinical observations, patient history, and epidemiological information. The expected result is Negative.  Fact Sheet for Patients:  PinkCheek.be  Fact Sheet for Healthcare Providers:  GravelBags.it  This test is no t yet approved or cleared by the [place] FDA and  has been authorized for detection and/or diagnosis of SARS-CoV-2 by FDA under an Emergency Use Authorization (EUA). This EUA will remain  in effect (meaning this test can be used) for the duration of the COVID-19 declaration under Section  564(b)(1) of the Act, 21 U.S.C. section 360bbb-3(b)(1), unless the authorization is terminated or revoked sooner.     Influenza A by PCR NEGATIVE NEGATIVE Final   Influenza B by PCR NEGATIVE NEGATIVE Final    Comment: (NOTE) The Xpert Xpress SARS-CoV-2/FLU/RSV assay is intended as an aid in  the diagnosis of influenza from Nasopharyngeal swab specimens and  should not be used as a sole basis for treatment. Nasal washings and  aspirates are unacceptable for Xpert Xpress SARS-CoV-2/FLU/RSV  testing.  Fact Sheet for Patients: PinkCheek.be  Fact Sheet for Healthcare Providers: GravelBags.it  This test is not yet approved or cleared by the [place] FDA and  has been authorized for detection and/or diagnosis of SARS-CoV-2 by  FDA under an Emergency Use Authorization (EUA). This EUA will remain  in effect (meaning this test can be used) for the duration of the  Covid-19 declaration under Section 564(b)(1) of the Act, 21  U.S.C. section 360bbb-3(b)(1), unless the authorization is  terminated or revoked.    Respiratory Syncytial Virus by PCR NEGATIVE NEGATIVE Final    Comment: (NOTE) Fact Sheet for Patients: PinkCheek.be  Fact Sheet for Healthcare Providers: GravelBags.it  This test is not yet approved or cleared by the [place] FDA and  has been authorized for detection and/or diagnosis of SARS-CoV-2 by  FDA under an Emergency Use Authorization (EUA). This EUA will remain  in effect (meaning this test can be used) for the duration of the  COVID-19 declaration under Section 564(b)(1) of the Act, 21 U.S.C.  section 360bbb-3(b)(1), unless the authorization is terminated or  revoked. Performed at [facility], [address], [place], [place] [address]      Time coordinating discharge: 6mins  SIGNED:   Kathie Dike, MD  [facility] 01/24/2020,  8:39 PM   If 7PM-7AM, please contact night-coverage [email or web address]

## 2020-01-29 ENCOUNTER — Encounter (HOSPITAL_COMMUNITY): Payer: Self-pay | Admitting: Gastroenterology

## 2020-02-15 ENCOUNTER — Telehealth (INDEPENDENT_AMBULATORY_CARE_PROVIDER_SITE_OTHER): Payer: Self-pay | Admitting: *Deleted

## 2020-02-15 ENCOUNTER — Other Ambulatory Visit: Payer: Self-pay

## 2020-02-15 ENCOUNTER — Encounter (INDEPENDENT_AMBULATORY_CARE_PROVIDER_SITE_OTHER): Payer: Self-pay | Admitting: Gastroenterology

## 2020-02-15 ENCOUNTER — Encounter (INDEPENDENT_AMBULATORY_CARE_PROVIDER_SITE_OTHER): Payer: Self-pay | Admitting: *Deleted

## 2020-02-15 ENCOUNTER — Ambulatory Visit (INDEPENDENT_AMBULATORY_CARE_PROVIDER_SITE_OTHER): Payer: Medicare HMO | Admitting: Gastroenterology

## 2020-02-15 VITALS — BP 145/100 | HR 128 | Temp 97.9°F | Ht 67.0 in | Wt 159.4 lb

## 2020-02-15 DIAGNOSIS — K529 Noninfective gastroenteritis and colitis, unspecified: Secondary | ICD-10-CM | POA: Diagnosis not present

## 2020-02-15 DIAGNOSIS — R112 Nausea with vomiting, unspecified: Secondary | ICD-10-CM | POA: Diagnosis not present

## 2020-02-15 DIAGNOSIS — R1030 Lower abdominal pain, unspecified: Secondary | ICD-10-CM | POA: Diagnosis not present

## 2020-02-15 DIAGNOSIS — E278 Other specified disorders of adrenal gland: Secondary | ICD-10-CM | POA: Insufficient documentation

## 2020-02-15 DIAGNOSIS — Z8719 Personal history of other diseases of the digestive system: Secondary | ICD-10-CM | POA: Insufficient documentation

## 2020-02-15 MED ORDER — ONDANSETRON HCL 4 MG PO TABS: 4.0000 mg | ORAL_TABLET | Freq: Three times a day (TID) | ORAL | 1 refills | Status: DC | PRN

## 2020-02-15 MED ORDER — DICYCLOMINE HCL 10 MG PO CAPS: 10.0000 mg | ORAL_CAPSULE | Freq: Three times a day (TID) | ORAL | 3 refills | Status: DC | PRN

## 2020-02-15 MED ORDER — PLENVU 140 G PO SOLR: 1.0000 | Freq: Once | ORAL | 0 refills | Status: AC

## 2020-02-15 NOTE — Patient Instructions (Addendum)
Continue taking pantoprazole 40 mg twice a day for 2 months Avoid using NSAIDs such as BC/good powders, Aleve, ibuprofen, naproxen, Motrin, Voltaren or Advil (even the topical ones) Schedule EGD and colonoscopy in 5 weeks Can take Zofran as needed for nausea/vomiting Can take  Bentyl 10 mg as needed for abdominal pain Start taking Miralax  1 cup every 12 hours. If after two weeks there is no improvement, increase to 1 cup every 8 hours Follow up with PCP and endocrinologist Perform blood workup

## 2020-02-15 NOTE — Progress Notes (Signed)
Matthew Nguyen, M.D. Gastroenterology & Hepatology [facility] [address] [place], [place] [address]  Primary Care Physician: Abran Richard, MD [address] [place] [address]  I will communicate my assessment and recommendations to the referring MD via EMR. "Note: Occasional unusual wording and randomly placed punctuation marks may result from the use of speech recognition technology to transcribe this document"  Problems: 1. Duodenal ulcers 2. Recurrent abdominal pain  History of Present Illness: Matthew Nguyen is a 49 y.o.year old malewith history of GERD, migraines, seizures and previous duodenal ulcers, who presents for follow up after recent hospitalization for melena, found to have duodenal ulcers.  Briefly, the patient was admitted to the hospital on 01/22/2020 after presenting episodes of new onset melena and lower abdominal pain with constipation.  The patient was taking BC powders chronically for migraines.  He was found to have a drop in his hemoglobin to 10.8 upon admission but he remained hemodynamically stable during his hospitalization.  Patient underwent an emergent EGD which showed presence of erythematous erosions in the fundus and antrum.  A Forrest IIc ulcer was found in the duodenal sweep, close to 1.2 cm in diameter.  Hematin was not observed.  Patient was discharged from the hospital with PPI twice daily and Carafate.  Patient reports that since he was discharged from the hospital, he has  presented recurrent episodes of severe abdominal pain which is generalized but worse in the lower abdominal area.  The pain does not radiate anywhere else but he reports that has caused significant discomfort.  Notably, the patient reports that he drank a beer after being hospitalized but has not drank more since then. He has been compliant in his intake of pantoprazole 40 mg BID and sucralfate 1g QID.  Notably, he  reports that he has presented some improvement in his bowel movement frequency, patient states he is taking Miralax 1 cap every day and also taking Dulcolax every night. He is having a BM 3 times a week, which is better than in the past as he had a BM every week. He reports having episodes of melena maybe once a week but this is not as frequent as in the past. Has seen some occasional fresh blood in his stool a couple of times.  Has not tried any more BC or Goody powders, has only taken Tylenol to improve his migraines.  Also, patient reports he is having vomiting in the afternoon usually, on a daily basis.  He reports that he did not have vomiting episodes previously but this is a new symptom.  Of note, upon review of his abdominal imaging, there is mention of thickening and inflammatory findings diffusely in the colon on the CT abdomen performed on 01/22/2020.  The patient denies having any diarrhea.  The patient denies having any fever, chills, hematemesis, abdominal distention, jaundice, pruritus or unintentional weight loss.  Last Colonoscopy: never  Past Medical History: Past Medical History:  Diagnosis Date  . GERD (gastroesophageal reflux disease)   . Migraines   . Seizure [facility])     Past Surgical History: Past Surgical History:  Procedure Laterality Date  . ESOPHAGOGASTRODUODENOSCOPY (EGD) WITH PROPOFOL N/A 01/23/2020   Procedure: ESOPHAGOGASTRODUODENOSCOPY (EGD) WITH PROPOFOL;  Surgeon: Harvel Quale, MD;  Location: [facility] END[facility];  Service: Gastroenterology;  Laterality: N/A;  . UMBILICAL HERNIA REPAIR N/A 06/06/2018   Procedure: HERNIA REPAIR UMBILICAL ADULT WITH MESH;  Surgeon: Aviva Signs, MD;  Location: [facility] ORS;  Service: General;  Laterality: N/A;    Family History: Family History  Problem Relation Age of Onset  . Heart disease Father     Social History: Social History   Tobacco Use  Smoking Status Current Every Day Smoker  . Packs/day: 1.00   Smokeless Tobacco Current User  . Types: Chew   Social History   Substance and Sexual Activity  Alcohol Use Yes   Comment: occasionally   Social History   Substance and Sexual Activity  Drug Use Yes  . Types: Marijuana    Allergies: Allergies  Allergen Reactions  . Morphine Nausea Only    Medications: Current Outpatient Medications  Medication Sig Dispense Refill  . amitriptyline (ELAVIL) 50 MG tablet TAKE 3 TABLETS (150 MG TOTAL) BY MOUTH AT BEDTIME. 270 tablet 1  . divalproex (DEPAKOTE) 500 MG DR tablet Take 2 tablets in the morning and two tablets at night. 360 tablet 3  . Galcanezumab-gnlm (EMGALITY) 120 MG/ML SOAJ Inject 1 pen into the skin every 30 (thirty) days. 1 pen 11  . Oxcarbazepine (TRILEPTAL) 300 MG tablet Take 2 tablets twice a day 360 tablet 3  . pantoprazole (PROTONIX) 40 MG tablet Take 1 tablet (40 mg total) by mouth 2 (two) times daily before a meal. 60 tablet 2  . polyethylene glycol (MIRALAX / GLYCOLAX) 17 g packet Take 17 g by mouth daily. 14 each 0  . sucralfate (CARAFATE) 1 GM/10ML suspension Take 10 mLs (1 g total) by mouth 4 (four) times daily. 420 mL 0  . SUMAtriptan (IMITREX) 50 MG tablet Take 1 tablet (50 mg total) by mouth every 2 (two) hours as needed for migraine or headache. May repeat in 2 hours if headache persists or recurs. 10 tablet 11  . zonisamide (ZONEGRAN) 100 MG capsule TAKE 4 CAPSULES AT BEDTIME. 360 capsule 3  . dicyclomine (BENTYL) 20 MG tablet Take 20 mg by mouth 3 (three) times daily as needed. (Patient not taking: Reported on 01/22/2020)    . zolpidem (AMBIEN) 5 MG tablet Take 5 mg by mouth at bedtime.     No current facility-administered medications for this visit.    Review of Systems: GENERAL: negative for malaise, night sweats HEENT: No changes in hearing or vision, no nose bleeds or other nasal problems. NECK: Negative for lumps, goiter, pain and significant neck swelling RESPIRATORY: Negative for cough,  wheezing CARDIOVASCULAR: Negative for chest pain, leg swelling, palpitations, orthopnea GI: SEE HPI MUSCULOSKELETAL: Negative for joint pain or swelling, back pain, and muscle pain. SKIN: Negative for lesions, rash PSYCH: Negative for sleep disturbance, mood disorder and recent psychosocial stressors. HEMATOLOGY Negative for prolonged bleeding, bruising easily, and swollen nodes. ENDOCRINE: Negative for cold or heat intolerance, polyuria, polydipsia and goiter. NEURO: negative for tremor, gait imbalance, syncope and seizures. The remainder of the review of systems is noncontributory.   Physical Exam: BP (!) 145/100 (BP Location: Right Arm, Patient Position: Sitting, Cuff Size: Normal)   Pulse (!) 128   Temp 97.9 F (36.6 C) (Oral)   Ht 5\' 7"  (1.702 m)   Wt 159 lb 6.4 oz (72.3 kg)   BMI 24.97 kg/m  GENERAL: The patient is AO x3, in no acute distress. HEENT: Head is normocephalic and atraumatic. EOMI are intact. Mouth is well hydrated and without lesions. NECK: Supple. No masses LUNGS: Clear to auscultation. No presence of rhonchi/wheezing/rales. Adequate chest expansion HEART: RRR, normal s1 and s2. ABDOMEN: Mildly tender to palpation in both flanks but no guarding, no peritoneal signs, and  nondistended. BS +. No masses. EXTREMITIES: Without any cyanosis, clubbing, rash, lesions or edema. NEUROLOGIC: AOx3, no focal motor deficit. SKIN: no jaundice, no rashes  Imaging/Labs: as above  I personally reviewed and interpreted the available labs, imaging and endoscopic files.  Impression and Plan: Matthew Nguyen is a 49 y.o.year old malewith history of GERD, migraines, seizures and previous duodenal ulcers, who presents for follow up after recent hospitalization for melena, found to have duodenal ulcers.  Patient has presented improvement of his episode of gastrointestinal bleeding due to NSAID related duodenal ulcers.  He has been compliant with intake of PPI and Carafate.  I advised  him he needs to take the pantoprazole for 2 more months and continue to avoid the [facility] powders that were the culprit for his ulceration.  However, we will need to perform an EGD for evaluation of his abdominal pain consideration for gastric biopsies if there is persistent inflammation.  Given the fact the patient had some questionable thickening of his colon in most recent imaging, we will also proceed with a colonoscopy at the same time to evaluate his abdominal pain.  For now, he would benefit from taking Zofran as needed for nausea and vomiting episodes, as well as taking Bentyl as needed to decrease his abdominal pain.  He will also benefit from increasing his MiraLAX intake to improve his bowel movement frequency.  Finally, the patient was found to have an adrenal nodule on his most recent CT scan, he will be referred to endocrinology for further evaluation.  -Continue taking pantoprazole 40 mg twice a day for 2 months -Avoid using NSAIDs such as BC/good powders, Aleve, ibuprofen, naproxen, Motrin, -Voltaren or Advil (even the topical ones) -Schedule EGD and colonoscopy in 5 weeks -Can take Zofran as needed for nausea/vomiting -Can take  Bentyl 10 mg as needed for abdominal pain -Start taking Miralax  1 cup every 12 hours. If after two weeks there is no improvement, increase to 1 cup every 8 hours - Referral to endocrinologist - Check CBC and CMP  All questions were answered.      Harvel Quale, MD Gastroenterology and Hepatology [facility]

## 2020-02-15 NOTE — Telephone Encounter (Signed)
Patient needs Plenvu (copay card) ° °

## 2020-02-27 ENCOUNTER — Ambulatory Visit: Payer: Medicare HMO | Admitting: "Endocrinology

## 2020-02-27 ENCOUNTER — Encounter: Payer: Self-pay | Admitting: "Endocrinology

## 2020-02-27 ENCOUNTER — Encounter (INDEPENDENT_AMBULATORY_CARE_PROVIDER_SITE_OTHER): Payer: Self-pay

## 2020-02-27 ENCOUNTER — Other Ambulatory Visit: Payer: Self-pay

## 2020-02-27 VITALS — BP 124/84 | HR 100 | Ht 67.0 in | Wt 160.8 lb

## 2020-02-27 DIAGNOSIS — E278 Other specified disorders of adrenal gland: Secondary | ICD-10-CM | POA: Diagnosis not present

## 2020-02-27 DIAGNOSIS — F17200 Nicotine dependence, unspecified, uncomplicated: Secondary | ICD-10-CM | POA: Diagnosis not present

## 2020-02-27 NOTE — Progress Notes (Signed)
Endocrinology Consult Note                                            02/27/2020, 4:59 PM   Subjective:    Patient ID: Matthew Nguyen, male    DOB: [birth year], PCP Abran Richard, MD   Past Medical History:  Diagnosis Date  . GERD (gastroesophageal reflux disease)   . Migraines   . Seizure [facility])    Past Surgical History:  Procedure Laterality Date  . ESOPHAGOGASTRODUODENOSCOPY (EGD) WITH PROPOFOL N/A 01/23/2020   Procedure: ESOPHAGOGASTRODUODENOSCOPY (EGD) WITH PROPOFOL;  Surgeon: Harvel Quale, MD;  Location: [facility] END[facility];  Service: Gastroenterology;  Laterality: N/A;  . UMBILICAL HERNIA REPAIR N/A 06/06/2018   Procedure: HERNIA REPAIR UMBILICAL ADULT WITH MESH;  Surgeon: Aviva Signs, MD;  Location: [facility] ORS;  Service: General;  Laterality: N/A;   Social History   Socioeconomic History  . Marital status: Single    Spouse name: Not on file  . Number of children: 0  . Years of education: Not on file  . Highest education level: Not on file  Occupational History  . Not on file  Tobacco Use  . Smoking status: Current Every Day Smoker    Packs/day: 1.00  . Smokeless tobacco: Current User    Types: Chew  Vaping Use  . Vaping Use: Never used  Substance and Sexual Activity  . Alcohol use: Yes    Comment: occasionally  . Drug use: Yes    Types: Marijuana  . Sexual activity: Yes    Partners: Female  Other Topics Concern  . Not on file  Social History Narrative   Pt lives alone in 1 story home   No children   12th grade education   Past work - Ship broker / now on disability   Social Determinants of Radio broadcast assistant Strain:   . Difficulty of Paying Living Expenses: Not on file  Food Insecurity:   . Worried About Charity fundraiser in the Last Year: Not on file  . Ran Out of Food in the Last Year: Not on file  Transportation Needs:   . Lack of Transportation (Medical): Not on file  . Lack of Transportation (Non-Medical):  Not on file  Physical Activity:   . Days of Exercise per Week: Not on file  . Minutes of Exercise per Session: Not on file  Stress:   . Feeling of Stress : Not on file  Social Connections:   . Frequency of Communication with Friends and Family: Not on file  . Frequency of Social Gatherings with Friends and Family: Not on file  . Attends Religious Services: Not on file  . Active Member of Clubs or Organizations: Not on file  . Attends Archivist Meetings: Not on file  . Marital Status: Not on file   Family History  Problem Relation Age of Onset  . Heart disease Father    Outpatient Encounter Medications as of 02/27/2020  Medication Sig  . amitriptyline (ELAVIL) 50 MG tablet TAKE 3 TABLETS (150 MG TOTAL) BY MOUTH AT BEDTIME.  Marland Kitchen dicyclomine (BENTYL) 10 MG capsule Take 1 capsule (10 mg total) by mouth every 8 (eight) hours as needed for spasms.  . divalproex (DEPAKOTE) 500 MG DR tablet Take 2 tablets in the morning and two tablets at night.  Marland Kitchen  Galcanezumab-gnlm (EMGALITY) 120 MG/ML SOAJ Inject 1 pen into the skin every 30 (thirty) days.  . ondansetron (ZOFRAN) 4 MG tablet Take 1 tablet (4 mg total) by mouth every 8 (eight) hours as needed for nausea or vomiting.  . Oxcarbazepine (TRILEPTAL) 300 MG tablet Take 2 tablets twice a day  . pantoprazole (PROTONIX) 40 MG tablet Take 1 tablet (40 mg total) by mouth 2 (two) times daily before a meal.  . polyethylene glycol (MIRALAX / GLYCOLAX) 17 g packet Take 17 g by mouth daily.  . SUMAtriptan (IMITREX) 50 MG tablet Take 1 tablet (50 mg total) by mouth every 2 (two) hours as needed for migraine or headache. May repeat in 2 hours if headache persists or recurs.  Marland Kitchen zonisamide (ZONEGRAN) 100 MG capsule TAKE 4 CAPSULES AT BEDTIME.  . [DISCONTINUED] sucralfate (CARAFATE) 1 GM/10ML suspension Take 10 mLs (1 g total) by mouth 4 (four) times daily.  . [DISCONTINUED] zolpidem (AMBIEN) 5 MG tablet Take 5 mg by mouth at bedtime.   No  facility-administered encounter medications on file as of 02/27/2020.   ALLERGIES: Allergies  Allergen Reactions  . Morphine Nausea Only    VACCINATION STATUS: Immunization History  Administered Date(s) Administered  . Tdap 09/18/2013    HPI Matthew Nguyen is 49 y.o. male who presents today with a medical history as above. he is being seen in consultation for right adrenal mass requested by Abran Richard, MD. History is obtained directly from the patient and chart review. He is aware of the fact that he has adrenal lesion for several years. Review of his medical records reveals that in 2013, a [employer] system CT scan of his abdomen showed right adrenal lesion-which was not definitively characterized as an adenoma. He did not have subsequent work-up no problem with this lesion until he was incidentally re-discovered while he underwent CT scan of his abdomen in [facility] while he was undergoing work-up for possible bowel obstruction.   Patient denies any history of uncontrolled hypertension, paroxysmal headaches nor diaphoresis. He has seizure disorder on multiple antiseizure medications. He has no medical history of adrenal dysfunction, although no functional study is present in his medical records.  He is a chronic smoker. His CT also showed coronary artery disease/aortic atherosclerosis. -He has no family history of thyroid, adrenal, pituitary, parathyroid dysfunction.  Review of Systems  Constitutional: + Minimally fluctuating body weight, + fatigue, no subjective hyperthermia, no subjective hypothermia Eyes: no blurry vision, no xerophthalmia ENT: no sore throat, no nodules palpated in throat, no dysphagia/odynophagia, no hoarseness Cardiovascular: no Chest Pain, no Shortness of Breath, no palpitations, no leg swelling Respiratory: + cough, no shortness of breath Gastrointestinal: no Nausea/Vomiting/Diarhhea Musculoskeletal: no muscle/joint aches Skin: no  rashes Neurological: no tremors, no numbness, no tingling, no dizziness Psychiatric: no depression, no anxiety  Objective:    Vitals with BMI 02/27/2020 02/15/2020 01/24/2020  Height 5\' 7"  5\' 7"  -  Weight 160 lbs 13 oz 159 lbs 6 oz -  BMI 18.84 16.60 -  Systolic 630 160 109  Diastolic 84 323 92  Pulse 100 128 105    BP 124/84   Pulse 100   Ht 5\' 7"  (1.702 m)   Wt 160 lb 12.8 oz (72.9 kg)   BMI 25.18 kg/m   Wt Readings from Last 3 Encounters:  02/27/20 160 lb 12.8 oz (72.9 kg)  02/15/20 159 lb 6.4 oz (72.3 kg)  01/24/20 157 lb 10.1 oz (71.5 kg)    Physical Exam  Constitutional:  Body mass index is 25.18 kg/m.,  not in acute distress, normal state of mind Eyes: PERRLA, EOMI, no exophthalmos ENT: moist mucous membranes, no gross thyromegaly, no gross cervical lymphadenopathy Cardiovascular: normal precordial activity, Regular Rate and Rhythm, no Murmur/Rubs/Gallops Respiratory:  adequate breathing efforts, no gross chest deformity, Clear to auscultation bilaterally Gastrointestinal: abdomen soft, Non -tender, No distension, Bowel Sounds present, no gross organomegaly Musculoskeletal: no gross deformities, strength intact in all four extremities Skin: moist, warm, no rashes Neurological: no tremor with outstretched hands, Deep tendon reflexes normal in bilateral lower extremities.  CMP ( most recent) CMP     Component Value Date/Time   NA 133 (L) 01/22/2020 1158   NA 141 06/06/2011 1025   K 3.6 01/22/2020 1158   K 3.8 06/06/2011 1025   CL 99 01/22/2020 1158   CL 105 06/06/2011 1025   CO2 22 01/22/2020 1158   CO2 24 06/06/2011 1025   GLUCOSE 108 (H) 01/22/2020 1158   GLUCOSE 151 (H) 06/06/2011 1025   BUN 36 (H) 01/22/2020 1158   BUN 11 06/06/2011 1025   CREATININE 0.94 01/22/2020 1158   CREATININE 1.02 06/06/2011 1025   CALCIUM 9.2 01/22/2020 1158   CALCIUM 8.2 (L) 06/06/2011 1025   PROT 6.8 01/22/2020 1158   PROT 6.7 06/06/2011 1025   ALBUMIN 3.7 01/22/2020  1158   ALBUMIN 3.8 06/06/2011 1025   AST 15 01/22/2020 1158   AST 26 06/06/2011 1025   ALT 13 01/22/2020 1158   ALT 23 06/06/2011 1025   ALKPHOS 47 01/22/2020 1158   ALKPHOS 55 06/06/2011 1025   BILITOT 0.5 01/22/2020 1158   BILITOT 0.2 06/06/2011 1025   GFRNONAA >60 01/22/2020 1158   GFRNONAA >60 06/06/2011 1025   GFRAA >60 10/24/2018 0851   GFRAA >60 06/06/2011 1025   CT abdomen with contrast in [facility] on 01/22/2020 IMPRESSION: 1. The descending colon is decompressed, however there may be some wall thickening and adjacent inflammatory stranding suggestive of nonspecific infectious, inflammatory, or ischemic colitis. 2. There is no evidence of bowel obstruction and there is scattered gas and stool present to the rectum. 3. 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 This is nonspecific. Given size greater than 4 cm, recommend surgical referral with consideration of biopsy, resection, or metabolic characterization by PET-CT. 4. Coronary artery disease.  Aortic Atherosclerosis (ICD10-I70.0).     Assessment & Plan:   1. Adrenal nodule ([place])  - Matthew Nguyen  is being seen at a kind request of Abran Richard, MD. - I have reviewed his available adrenal records and clinically evaluated the patient. 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 -Patient does not have paroxysmal symptoms suggestive of pheochromocytoma nor Cushing syndrome. Pretest probability for pheochromocytoma is very low. However, he would benefit from functional study with 24-hour urine measurements of catecholamines and metanephrines.  -If functional studies are abnormal, he will be sent for adrenal protocol CT scan and possible subsequent surgical referral given the sheer size of the lesion in a patient with chronic heavy smoker at 4.5 cm.  - I did not initiate  any new prescriptions today.  The patient was counseled on the dangers of tobacco use, and was advised to quit.  Reviewed strategies to maximize success, including removing cigarettes and smoking materials from environment.  - he is advised to maintain close follow up with  Abran Richard, MD for primary care needs.   - Time spent with the patient: 45 minutes, of which >50% was spent in  counseling him about his right adrenal adenoma/lesion and the rest in obtaining information about his symptoms, reviewing his previous labs/studies ( including abstractions from other facilities),  evaluations, and treatments,  and developing a plan to confirm diagnosis and long term treatment based on the latest standards of care/guidelines; and documenting his care.  Cline Cools participated in the discussions, expressed understanding, and voiced agreement with the above plans.  All questions were answered to his satisfaction. he is encouraged to contact clinic should he have any questions or concerns prior to his return visit.  Follow up plan: Return in about 2 weeks (around 03/12/2020) for [place], MD [facility] [address] [place], [place] [address] Phone: [phone]  Fax: [phone]     02/27/2020, 4:59 PM  This note was partially dictated with voice recognition software. Similar sounding words can be transcribed inadequately or may not  be corrected upon review.

## 2020-03-12 ENCOUNTER — Other Ambulatory Visit: Payer: Self-pay

## 2020-03-12 ENCOUNTER — Other Ambulatory Visit (HOSPITAL_COMMUNITY)
Admission: RE | Admit: 2020-03-12 | Discharge: 2020-03-12 | Disposition: A | Discharge status: Home / Self Care | Payer: Medicare HMO | Source: Ambulatory Visit | Attending: "Endocrinology | Admitting: "Endocrinology

## 2020-03-12 ENCOUNTER — Other Ambulatory Visit (INDEPENDENT_AMBULATORY_CARE_PROVIDER_SITE_OTHER): Payer: Self-pay | Admitting: *Deleted

## 2020-03-12 ENCOUNTER — Ambulatory Visit: Payer: Medicare HMO | Admitting: "Endocrinology

## 2020-03-12 DIAGNOSIS — E278 Other specified disorders of adrenal gland: Secondary | ICD-10-CM | POA: Diagnosis present

## 2020-03-12 LAB — CREATININE, URINE, 24 HOUR
Collection Interval-UCRE24: 24 hours
Creatinine, 24H Ur: 1051 mg/d (ref 800–2000)
Creatinine, Urine: 68.91 mg/dL
Urine Total Volume-UCRE24: 1525 mL

## 2020-03-15 LAB — METANEPHRINES, URINE, 24 HOUR
Metaneph Total, Ur: 50 ug/L
Metanephrines, 24H Ur: 76 ug/24 hr (ref 58–276)
Normetanephrine, 24H Ur: 364 ug/24 hr (ref 156–729)
Normetanephrine, Ur: 239 ug/L
Total Volume: 1525

## 2020-03-17 LAB — CATECHOLAMINES,UR.,FREE,24 HR: Total Volume: 1525

## 2020-03-17 LAB — CORTISOL, URINE, FREE
Cortisol (Ur), Free: 15 ug/24 hr (ref 5–64)
Cortisol,F,ug/L,U: 10 ug/L
Total Volume: 1525

## 2020-03-26 ENCOUNTER — Other Ambulatory Visit: Payer: Self-pay

## 2020-03-26 ENCOUNTER — Encounter (INDEPENDENT_AMBULATORY_CARE_PROVIDER_SITE_OTHER): Payer: Self-pay | Admitting: Gastroenterology

## 2020-03-26 ENCOUNTER — Ambulatory Visit (INDEPENDENT_AMBULATORY_CARE_PROVIDER_SITE_OTHER): Payer: Medicare HMO | Admitting: "Endocrinology

## 2020-03-26 ENCOUNTER — Telehealth: Payer: Self-pay

## 2020-03-26 ENCOUNTER — Other Ambulatory Visit (HOSPITAL_COMMUNITY)
Admission: RE | Admit: 2020-03-26 | Discharge: 2020-03-26 | Disposition: A | Discharge status: Home / Self Care | Payer: Medicare HMO | Source: Ambulatory Visit | Attending: Gastroenterology | Admitting: Gastroenterology

## 2020-03-26 ENCOUNTER — Ambulatory Visit (INDEPENDENT_AMBULATORY_CARE_PROVIDER_SITE_OTHER): Payer: Medicare HMO | Admitting: Gastroenterology

## 2020-03-26 ENCOUNTER — Encounter: Payer: Self-pay | Admitting: "Endocrinology

## 2020-03-26 VITALS — BP 143/98 | HR 92 | Temp 98.1°F | Ht 67.0 in | Wt 158.0 lb

## 2020-03-26 VITALS — BP 110/84 | HR 100 | Ht 67.0 in | Wt 157.0 lb

## 2020-03-26 DIAGNOSIS — E278 Other specified disorders of adrenal gland: Secondary | ICD-10-CM | POA: Diagnosis not present

## 2020-03-26 DIAGNOSIS — Z01812 Encounter for preprocedural laboratory examination: Secondary | ICD-10-CM | POA: Diagnosis present

## 2020-03-26 DIAGNOSIS — K589 Irritable bowel syndrome without diarrhea: Secondary | ICD-10-CM

## 2020-03-26 DIAGNOSIS — Z8719 Personal history of other diseases of the digestive system: Secondary | ICD-10-CM | POA: Diagnosis not present

## 2020-03-26 DIAGNOSIS — Z20822 Contact with and (suspected) exposure to covid-19: Secondary | ICD-10-CM | POA: Diagnosis not present

## 2020-03-26 DIAGNOSIS — F17200 Nicotine dependence, unspecified, uncomplicated: Secondary | ICD-10-CM | POA: Diagnosis not present

## 2020-03-26 LAB — SARS CORONAVIRUS 2 (TAT 6-24 HRS): SARS Coronavirus 2: NEGATIVE

## 2020-03-26 NOTE — Progress Notes (Signed)
Matthew Nguyen, M.D. Gastroenterology & Hepatology [facility] [address] [place], [place] [address]  Primary Care Physician: Abran Richard, MD [address] [place]  [id number]  I will communicate my assessment and recommendations to the referring MD via EMR. "Note: Occasional unusual wording and randomly placed punctuation marks may result from the use of speech recognition technology to transcribe this document"  Problems: 1. Duodenal ulcers 2. IBS-C 3.  Adrenal nodule  History of Present Illness: Stanlee Roehrig is a 49 y.o. male old malewith history of GERD, migraines, seizures  duodenal ulcers due to NSAIDs and IBS-C, who presents for follow up of abdominal pain.  The patient was last seen on 02/15/2020. At that time, the patient was counseled to continue taking PPI twice daily, as he was scheduled to perform an EGD and colonoscopy, which she will perform tomorrow.  He was given prescription for Zofran as needed for nausea and vomiting, as well as Bentyl 10 mg as needed for abdominal pain.  He was counseled to start taking MiraLAX.  The patient was referred to endocrinology for evaluation of adrenal nodule.  Patient reports that he is feeling better.  He has some abdominal cramping twice a week, which happens when he does not eat on time.  However, he reports that the abdominal pain is much less frequent and severe than in the past.  States that Bentyl has helped with pain, he only takes it when the pain is severe. The patient denies having any heartburn, dysphagia, nausea, vomiting, fever, chills, hematochezia, melena, hematemesis, abdominal distention, abdominal pain, diarrhea, jaundice, pruritus or weight loss. He is still smoking 20 cigs a day. He is also chewing tobacco frequently.  Has not taken any NSAIDs or high dose aspirin since his last hospitalization.  Patient was seen by Dr. Dorris Fetch today, he was referred for  evaluation by surgery to undergo resection of nodule.  Past Medical History: Past Medical History:  Diagnosis Date  . GERD (gastroesophageal reflux disease)   . Migraines   . Seizure [facility])     Past Surgical History: Past Surgical History:  Procedure Laterality Date  . ESOPHAGOGASTRODUODENOSCOPY (EGD) WITH PROPOFOL N/A 01/23/2020   Procedure: ESOPHAGOGASTRODUODENOSCOPY (EGD) WITH PROPOFOL;  Surgeon: Harvel Quale, MD;  Location: [facility] ENDO SUITE;  Service: Gastroenterology;  Laterality: N/A;  . UMBILICAL HERNIA REPAIR N/A 06/06/2018   Procedure: HERNIA REPAIR UMBILICAL ADULT WITH MESH;  Surgeon: Aviva Signs, MD;  Location: [facility] ORS;  Service: General;  Laterality: N/A;    Family History: Family History  Problem Relation Age of Onset  . Heart disease Father     Social History: Social History   Tobacco Use  Smoking Status Current Every Day Smoker  . Packs/day: 1.00  Smokeless Tobacco Current User  . Types: Chew   Social History   Substance and Sexual Activity  Alcohol Use Yes   Comment: occasionally   Social History   Substance and Sexual Activity  Drug Use Yes  . Types: Marijuana    Allergies: Allergies  Allergen Reactions  . Morphine Nausea Only    Medications: Current Outpatient Medications  Medication Sig Dispense Refill  . amitriptyline (ELAVIL) 50 MG tablet TAKE 3 TABLETS (150 MG TOTAL) BY MOUTH AT BEDTIME. 270 tablet 1  . dicyclomine (BENTYL) 10 MG capsule Take 1 capsule (10 mg total) by mouth every 8 (eight) hours as needed for spasms. 60 capsule 3  . divalproex (DEPAKOTE) 500 MG DR tablet Take 2  tablets in the morning and two tablets at night. (Patient taking differently: Take 1,000 mg by mouth in the morning and at bedtime.) 360 tablet 3  . Galcanezumab-gnlm (EMGALITY) 120 MG/ML SOAJ Inject 1 pen into the skin every 30 (thirty) days. 1 pen 11  . ondansetron (ZOFRAN) 4 MG tablet Take 1 tablet (4 mg total) by mouth every 8 (eight) hours as  needed for nausea or vomiting. 30 tablet 1  . Oxcarbazepine (TRILEPTAL) 300 MG tablet Take 2 tablets twice a day (Patient taking differently: Take 600 mg by mouth in the morning and at bedtime.) 360 tablet 3  . pantoprazole (PROTONIX) 40 MG tablet Take 1 tablet (40 mg total) by mouth 2 (two) times daily before a meal. 60 tablet 2  . polyethylene glycol (MIRALAX / GLYCOLAX) 17 g packet Take 17 g by mouth daily. (Patient taking differently: Take 17 g by mouth in the morning and at bedtime.) 14 each 0  . SUMAtriptan (IMITREX) 50 MG tablet Take 1 tablet (50 mg total) by mouth every 2 (two) hours as needed for migraine or headache. May repeat in 2 hours if headache persists or recurs. 10 tablet 11  . zonisamide (ZONEGRAN) 100 MG capsule TAKE 4 CAPSULES AT BEDTIME. (Patient taking differently: Take 400 mg by mouth at bedtime.) 360 capsule 3  . PLENVU 140 g SOLR Take 1 kit by mouth as directed.     No current facility-administered medications for this visit.    Review of Systems: GENERAL: negative for malaise, night sweats HEENT: No changes in hearing or vision, no nose bleeds or other nasal problems. NECK: Negative for lumps, goiter, pain and significant neck swelling RESPIRATORY: Negative for cough, wheezing CARDIOVASCULAR: Negative for chest pain, leg swelling, palpitations, orthopnea GI: SEE HPI MUSCULOSKELETAL: Negative for joint pain or swelling, back pain, and muscle pain. SKIN: Negative for lesions, rash PSYCH: Negative for sleep disturbance, mood disorder and recent psychosocial stressors. HEMATOLOGY Negative for prolonged bleeding, bruising easily, and swollen nodes. ENDOCRINE: Negative for cold or heat intolerance, polyuria, polydipsia and goiter. NEURO: negative for tremor, gait imbalance, syncope and seizures. The remainder of the review of systems is noncontributory.   Physical Exam: BP (!) 143/98 (BP Location: Left Arm, Patient Position: Sitting, Cuff Size: Large)   Pulse 92    Temp 98.1 F (36.7 C) (Oral)   Ht $R'5\' 7"'oH$  (1.702 m)   Wt 158 lb (71.7 kg)   BMI 24.75 kg/m  GENERAL: The patient is AO x3, in no acute distress. HEENT: Head is normocephalic and atraumatic. EOMI are intact. Mouth is well hydrated and without lesions. NECK: Supple. No masses LUNGS: Clear to auscultation. No presence of rhonchi/wheezing/rales. Adequate chest expansion HEART: RRR, normal s1 and s2. ABDOMEN: Soft, nontender, no guarding, no peritoneal signs, and nondistended. BS +. No masses. EXTREMITIES: Without any cyanosis, clubbing, rash, lesions or edema. NEUROLOGIC: AOx3, no focal motor deficit. SKIN: no jaundice, no rashes  Imaging/Labs: as above  I personally reviewed and interpreted the available labs, imaging and endoscopic files.  Impression and Plan: Matthew Nguyen is a 49 y.o. male old malewith history of GERD, migraines, seizures  duodenal ulcers due to NSAIDs and IBS-C, who presents for follow up of abdominal pain.  The patient has presented improvement of his abdominal pain since the last time he was seen in clinic, although it is still present intermittently but with less severity.  It is very likely that the symptoms are related to a functional etiology such as IBS C as  he has had negative abdominal imaging and upper endoscopic investigations that would explain his persistent pain.  Will need to continue with Bentyl as needed to improve his symptoms as this has been effective to control them so far.  In terms of his duodenal ulcers, this is related to the intake of Goody powders in the past, which he has not been taking any more.  We will proceed with EGD to rule out any inflammatory etiology leading to his recurrent abdominal pain.  Will need to continue on PPI twice daily for now until tomorrow's evaluation.  He will also proceed with colonoscopy as scheduled in the past with screening purposes.  - Continue Bentyl as needed for abdominal pain - Continue omeprazole 40 mg BID for  now - Proceed with scheduled EGD and colonoscopy tomorrow - RTC 1 year  All questions were answered.      Harvel Quale, MD Gastroenterology and Hepatology [facility]

## 2020-03-26 NOTE — Telephone Encounter (Signed)
Matthew Nguyen with [name] called stating [facility] rejected the 24hr urine collection for catecholamines due to the pH being too high. Stated the collection may not have been preserved like it needed to be. States pt would need to recollect.

## 2020-03-26 NOTE — Progress Notes (Signed)
03/26/2020, 5:21 PM  Endocrinology follow-up note   Subjective:    Patient ID: Matthew Nguyen, male    DOB: 1970-10-02, PCP Abran Richard, MD   Past Medical History:  Diagnosis Date  . GERD (gastroesophageal reflux disease)   . Migraines   . Seizure [facility])    Past Surgical History:  Procedure Laterality Date  . ESOPHAGOGASTRODUODENOSCOPY (EGD) WITH PROPOFOL N/A 01/23/2020   Procedure: ESOPHAGOGASTRODUODENOSCOPY (EGD) WITH PROPOFOL;  Surgeon: Harvel Quale, MD;  Location: [facility] END[facility] SUITE;  Service: Gastroenterology;  Laterality: N/A;  . UMBILICAL HERNIA REPAIR N/A 06/06/2018   Procedure: HERNIA REPAIR UMBILICAL ADULT WITH MESH;  Surgeon: Aviva Signs, MD;  Location: [facility] ORS;  Service: General;  Laterality: N/A;   Social History   Socioeconomic History  . Marital status: Single    Spouse name: Not on file  . Number of children: 0  . Years of education: Not on file  . Highest education level: Not on file  Occupational History  . Not on file  Tobacco Use  . Smoking status: Current Every Day Smoker    Packs/day: 1.00  . Smokeless tobacco: Current User    Types: Chew  Vaping Use  . Vaping Use: Never used  Substance and Sexual Activity  . Alcohol use: Yes    Comment: occasionally  . Drug use: Yes    Types: Marijuana  . Sexual activity: Yes    Partners: Female  Other Topics Concern  . Not on file  Social History Narrative   Pt lives alone in 1 story home   No children   12th grade education   Past work - Ship broker / now on disability   Social Determinants of Radio broadcast assistant Strain: Not on [employer] Insecurity: Not on file  Transportation Needs: Not on file  Physical Activity: Not on file  Stress: Not on file  Social Connections: Not on file   Family History  Problem Relation Age of Onset  . Heart disease Father    Outpatient Encounter Medications as of 03/26/2020   Medication Sig  . amitriptyline (ELAVIL) 50 MG tablet TAKE 3 TABLETS (150 MG TOTAL) BY MOUTH AT BEDTIME.  Marland Kitchen dicyclomine (BENTYL) 10 MG capsule Take 1 capsule (10 mg total) by mouth every 8 (eight) hours as needed for spasms.  . divalproex (DEPAKOTE) 500 MG DR tablet Take 2 tablets in the morning and two tablets at night. (Patient taking differently: Take 1,000 mg by mouth in the morning and at bedtime.)  . Galcanezumab-gnlm (EMGALITY) 120 MG/ML SOAJ Inject 1 pen into the skin every 30 (thirty) days.  . ondansetron (ZOFRAN) 4 MG tablet Take 1 tablet (4 mg total) by mouth every 8 (eight) hours as needed for nausea or vomiting.  . Oxcarbazepine (TRILEPTAL) 300 MG tablet Take 2 tablets twice a day (Patient taking differently: Take 600 mg by mouth in the morning and at bedtime.)  . pantoprazole (PROTONIX) 40 MG tablet Take 1 tablet (40 mg total) by mouth 2 (two) times daily before a meal.  . PLENVU 140 g SOLR Take 1 kit by mouth as directed.  . polyethylene glycol (MIRALAX / GLYCOLAX) 17 g packet Take 17 g by mouth daily. (  Patient taking differently: Take 17 g by mouth in the morning and at bedtime.)  . SUMAtriptan (IMITREX) 50 MG tablet Take 1 tablet (50 mg total) by mouth every 2 (two) hours as needed for migraine or headache. May repeat in 2 hours if headache persists or recurs.  Marland Kitchen zonisamide (ZONEGRAN) 100 MG capsule TAKE 4 CAPSULES AT BEDTIME. (Patient taking differently: Take 400 mg by mouth at bedtime.)   No facility-administered encounter medications on file as of 03/26/2020.   ALLERGIES: Allergies  Allergen Reactions  . Morphine Nausea Only    VACCINATION STATUS: Immunization History  Administered Date(s) Administered  . Tdap 09/18/2013    HPI Matthew Nguyen is 49 y.o. male who presents today for follow-up with 24-hour urine studies.  He was seen in consultation for right adrenal mass requested by Abran Richard, MD. History is obtained directly from the patient and chart review. He  is aware of the fact that he has adrenal lesion for several years. Review of his medical records reveals that in 2013, a [employer] system CT scan of his abdomen showed 2.5 cm right adrenal lesion-which was not definitively characterized as an adenoma. He did not have subsequent work-up, no problem with this lesion until he was incidentally re-discovered while he underwent CT scan of his abdomen in [facility] while he was undergoing work-up for possible bowel obstruction in October 2021.   Patient denies any history of uncontrolled hypertension, paroxysmal headaches nor diaphoresis. He has seizure disorder on multiple antiseizure medications. He has no medical history of adrenal dysfunction.  His recent functional studies are all within normal limits-no evidence of adrenal Cushing's, pheochromocytoma.  He is a chronic smoker. His CT also showed coronary artery disease/aortic atherosclerosis. -He has no family history of thyroid, adrenal, pituitary, parathyroid dysfunction.  Review of Systems  Constitutional: + Minimally fluctuating body weight, + fatigue, no subjective hyperthermia, no subjective hypothermia Eyes: no blurry vision, no xerophthalmia ENT: no sore throat, no nodules palpated in throat, no dysphagia/odynophagia, no hoarseness Cardiovascular: no Chest Pain, no Shortness of Breath, no palpitations, no leg swelling Respiratory: + cough, no shortness of breath Gastrointestinal: no Nausea/Vomiting/Diarhhea Musculoskeletal: no muscle/joint aches Skin: no rashes Neurological: no tremors, no numbness, no tingling, no dizziness Psychiatric: no depression, no anxiety  Objective:    Vitals with BMI 03/26/2020 03/26/2020 02/27/2020  Height 5' 7" 5' 7" 5' 7"  Weight 158 lbs 157 lbs 160 lbs 13 oz  BMI 24.74 75.91 63.84  Systolic 665 993 570  Diastolic 98 84 84  Pulse 92 100 100    BP 110/84   Pulse 100   Ht 5' 7" (1.702 m)   Wt 157 lb (71.2 kg)   BMI 24.59 kg/m    Wt Readings from Last 3 Encounters:  03/26/20 158 lb (71.7 kg)  03/26/20 157 lb (71.2 kg)  02/27/20 160 lb 12.8 oz (72.9 kg)    Physical Exam  Constitutional:  Body mass index is 24.59 kg/m.,  not in acute distress, normal state of mind Eyes: PERRLA, EOMI, no exophthalmos ENT: moist mucous membranes, no gross thyromegaly, no gross cervical lymphadenopathy Cardiovascular: normal precordial activity, Regular Rate and Rhythm, no Murmur/Rubs/Gallops Respiratory:  adequate breathing efforts, no gross chest deformity, Clear to auscultation bilaterally Gastrointestinal: abdomen soft, Non -tender, No distension, Bowel Sounds present, no gross organomegaly Musculoskeletal: no gross deformities, strength intact in all four extremities Skin: moist, warm, no rashes Neurological: no tremor with outstretched hands, Deep tendon reflexes normal in bilateral lower extremities.  CMP ( most recent) CMP     Component Value Date/Time   NA 133 (L) 01/22/2020 1158   NA 141 06/06/2011 1025   K 3.6 01/22/2020 1158   K 3.8 06/06/2011 1025   CL 99 01/22/2020 1158   CL 105 06/06/2011 1025   CO2 22 01/22/2020 1158   CO2 24 06/06/2011 1025   GLUCOSE 108 (H) 01/22/2020 1158   GLUCOSE 151 (H) 06/06/2011 1025   BUN 36 (H) 01/22/2020 1158   BUN 11 06/06/2011 1025   CREATININE 0.94 01/22/2020 1158   CREATININE 1.02 06/06/2011 1025   CALCIUM 9.2 01/22/2020 1158   CALCIUM 8.2 (L) 06/06/2011 1025   PROT 6.8 01/22/2020 1158   PROT 6.7 06/06/2011 1025   ALBUMIN 3.7 01/22/2020 1158   ALBUMIN 3.8 06/06/2011 1025   AST 15 01/22/2020 1158   AST 26 06/06/2011 1025   ALT 13 01/22/2020 1158   ALT 23 06/06/2011 1025   ALKPHOS 47 01/22/2020 1158   ALKPHOS 55 06/06/2011 1025   BILITOT 0.5 01/22/2020 1158   BILITOT 0.2 06/06/2011 1025   GFRNONAA >60 01/22/2020 1158   GFRNONAA >60 06/06/2011 1025   GFRAA >60 10/24/2018 0851   GFRAA >60 06/06/2011 1025   CT abdomen with contrast in [facility] on  01/22/2020 IMPRESSION: 1. The descending colon is decompressed, however there may be some wall thickening and adjacent inflammatory stranding suggestive of nonspecific infectious, inflammatory, or ischemic colitis. 2. There is no evidence of bowel obstruction and there is scattered gas and stool present to the rectum. 3. 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 This is nonspecific. Given size greater than 4 cm, recommend surgical referral with consideration of biopsy, resection, or metabolic characterization by PET-CT. 4. Coronary artery disease.  Aortic Atherosclerosis (ICD10-I70.0).  Recent Results (from the past 2160 hour(s))  CBC with Differential     Status: Abnormal   Collection Time: 01/22/20 11:58 AM  Result Value Ref Range   WBC 16.8 (H) 4.0 - 10.5 K/uL    Comment: WHITE COUNT CONFIRMED ON SMEAR   RBC 3.86 (L) 4.22 - 5.81 MIL/uL   Hemoglobin 12.7 (L) 13.0 - 17.0 g/dL   HCT 37.9 (L) 39.0 - 52.0 %   MCV 98.2 80.0 - 100.0 fL   MCH 32.9 26.0 - 34.0 pg   MCHC 33.5 30.0 - 36.0 g/dL   RDW 13.2 11.5 - 15.5 %   Platelets 335 150 - 400 K/uL   nRBC 0.0 0.0 - 0.2 %   Neutrophils Relative % 73 %   Neutro Abs 12.2 (H) 1.7 - 7.7 K/uL   Lymphocytes Relative 9 %   Lymphs Abs 1.5 0.7 - 4.0 K/uL   Monocytes Relative 6 %   Monocytes Absolute 1.0 0.1 - 1.0 K/uL   Eosinophils Relative 11 %   Eosinophils Absolute 1.9 (H) 0.0 - 0.5 K/uL   Basophils Relative 0 %   Basophils Absolute 0.1 0.0 - 0.1 K/uL   Immature Granulocytes 1 %   Abs Immature Granulocytes 0.14 (H) 0.00 - 0.07 K/uL    Comment: Performed at [facility], [address] [place], [place] [address]  Comprehensive metabolic panel     Status: Abnormal   Collection Time: 01/22/20 11:58 AM  Result Value Ref Range   Sodium 133 (L) 135 - 145 mmol/L   Potassium 3.6 3.5 - 5.1 mmol/L   Chloride 99 98 - 111 mmol/L   CO2 22  22 - 32 mmol/L   Glucose, Bld 108 (H) 70 -  99 mg/dL    Comment: Glucose reference range applies only to samples taken after fasting for at least 8 hours.   BUN 36 (H) 6 - 20 mg/dL   Creatinine, Ser 0.94 0.61 - 1.24 mg/dL   Calcium 9.2 8.9 - 10.3 mg/dL   Total Protein 6.8 6.5 - 8.1 g/dL   Albumin 3.7 3.5 - 5.0 g/dL   AST 15 15 - 41 U/L   ALT 13 0 - 44 U/L   Alkaline Phosphatase 47 38 - 126 U/L   Total Bilirubin 0.5 0.3 - 1.2 mg/dL   GFR, Estimated >60 >60 mL/min   Anion gap 12 5 - 15    Comment: Performed at [facility], [address], [place], [place] [address]  Lipase, blood     Status: None   Collection Time: 01/22/20 11:58 AM  Result Value Ref Range   Lipase 24 11 - 51 U/L    Comment: Performed at [facility], [address], [place], [place] [address]  POC occult blood, ED     Status: Abnormal   Collection Time: 01/22/20  4:24 PM  Result Value Ref Range   Fecal Occult Bld POSITIVE (A) NEGATIVE  Resp Panel by RT PCR (RSV, Flu A&B, Covid) - Nasopharyngeal Swab     Status: None   Collection Time: 01/22/20  7:31 PM   Specimen: Nasopharyngeal Swab  Result Value Ref Range   SARS Coronavirus 2 by RT PCR NEGATIVE NEGATIVE    Comment: (NOTE) SARS-CoV-2 target nucleic acids are NOT DETECTED.  The SARS-CoV-2 RNA is generally detectable in upper respiratoy specimens during the acute phase of infection. The lowest concentration of SARS-CoV-2 viral copies this assay can detect is 131 copies/mL. A negative result does not preclude SARS-Cov-2 infection and should not be used as the sole basis for treatment or other patient management decisions. A negative result may occur with  improper specimen collection/handling, submission of specimen other than nasopharyngeal swab, presence of viral mutation(s) within the areas targeted by this assay, and inadequate number of viral copies (<131 copies/mL). A negative result must be combined with clinical observations, patient history, and epidemiological information. The expected result is  Negative.  Fact Sheet for Patients:  PinkCheek.be  Fact Sheet for Healthcare Providers:  GravelBags.it  This test is no t yet approved or cleared by the [place] FDA and  has been authorized for detection and/or diagnosis of SARS-CoV-2 by FDA under an Emergency Use Authorization (EUA). This EUA will remain  in effect (meaning this test can be used) for the duration of the COVID-19 declaration under Section 564(b)(1) of the Act, 21 U.S.C. section 360bbb-3(b)(1), unless the authorization is terminated or revoked sooner.     Influenza A by PCR NEGATIVE NEGATIVE   Influenza B by PCR NEGATIVE NEGATIVE    Comment: (NOTE) The Xpert Xpress SARS-CoV-2/FLU/RSV assay is intended as an aid in  the diagnosis of influenza from Nasopharyngeal swab specimens and  should not be used as a sole basis for treatment. Nasal washings and  aspirates are unacceptable for Xpert Xpress SARS-CoV-2/FLU/RSV  testing.  Fact Sheet for Patients: PinkCheek.be  Fact Sheet for Healthcare Providers: GravelBags.it  This test is not yet approved or cleared by the [place] FDA and  has been authorized for detection and/or diagnosis of SARS-CoV-2 by  FDA under an Emergency Use Authorization (EUA). This EUA will remain  in effect (meaning this test can  be used) for the duration of the  Covid-19 declaration under Section 564(b)(1) of the Act, 21  U.S.C. section 360bbb-3(b)(1), unless the authorization is  terminated or revoked.    Respiratory Syncytial Virus by PCR NEGATIVE NEGATIVE    Comment: (NOTE) Fact Sheet for Patients: PinkCheek.be  Fact Sheet for Healthcare Providers: GravelBags.it  This test is not yet approved or cleared by the [place] FDA and  has been authorized for detection and/or diagnosis of SARS-CoV-2 by  FDA  under an Emergency Use Authorization (EUA). This EUA will remain  in effect (meaning this test can be used) for the duration of the  COVID-19 declaration under Section 564(b)(1) of the Act, 21 U.S.C.  section 360bbb-3(b)(1), unless the authorization is terminated or  revoked. Performed at [facility], [address], [place], [place] [address]   CBC     Status: Abnormal   Collection Time: 01/22/20  9:47 PM  Result Value Ref Range   WBC 10.9 (H) 4.0 - 10.5 K/uL   RBC 3.26 (L) 4.22 - 5.81 MIL/uL   Hemoglobin 10.8 (L) 13.0 - 17.0 g/dL   HCT 31.6 (L) 39.0 - 52.0 %   MCV 96.9 80.0 - 100.0 fL   MCH 33.1 26.0 - 34.0 pg   MCHC 34.2 30.0 - 36.0 g/dL   RDW 13.1 11.5 - 15.5 %   Platelets 298 150 - 400 K/uL   nRBC 0.0 0.0 - 0.2 %    Comment: Performed at [facility], [address], [place], [place] [address]  HIV Antibody (routine testing w rflx)     Status: None   Collection Time: 01/23/20  4:58 AM  Result Value Ref Range   HIV Screen 4th Generation wRfx Non Reactive Non Reactive    Comment: Performed at [facility], [place] [address] [place], [place] [address]  Protime-INR     Status: None   Collection Time: 01/23/20  4:58 AM  Result Value Ref Range   Prothrombin Time 12.3 11.4 - 15.2 seconds   INR 1.0 0.8 - 1.2    Comment: (NOTE) INR goal varies based on device and disease states. Performed at [facility], [address] [place], [place] [address]   CBC     Status: Abnormal   Collection Time: 01/23/20  4:58 AM  Result Value Ref Range   WBC 11.5 (H) 4.0 - 10.5 K/uL   RBC 3.43 (L) 4.22 - 5.81 MIL/uL   Hemoglobin 11.4 (L) 13.0 - 17.0 g/dL   HCT 33.7 (L) 39.0 - 52.0 %   MCV 98.3 80.0 - 100.0 fL   MCH 33.2 26.0 - 34.0 pg   MCHC 33.8 30.0 - 36.0 g/dL   RDW 13.2 11.5 - 15.5 %   Platelets 311 150 - 400 K/uL   nRBC 0.0 0.0 - 0.2 %    Comment: Performed at [facility], [address] [place], [place] [address]  Catecholamines,Ur.,Free,24 Hh     Status: None   Collection Time:  03/12/20  2:00 PM  Result Value Ref Range   Epinephrine, Vonita Moss [facility] ug/L    Comment: (NOTE) Test not performed. The specimen received was not preserved correctly. The pH was too high therefore we are unable to obtain valid results for the following test(s): Notified Latisha H. This test was developed and its performance characteristics determined by [facility]. It has not been cleared or approved by the Food and Drug Administration.    Norepinephrine, Rand Ur NOT PERFORMED  Comment: (NOTE) Test not performed This test was developed and its performance characteristics determined by [facility]. It has not been cleared or approved by the Food and Drug Administration.    Dopamine, Rand Ur NOT PERFORMED     Comment: (NOTE) Test not performed This test was developed and its performance characteristics determined by [facility]. It has not been cleared or approved by the Food and Drug Administration.    Total Volume 1,525     Comment: Performed at [facility], [address] [place], [place] [address]  Creatinine, urine, 24 hour     Status: None   Collection Time: 03/12/20  2:00 PM  Result Value Ref Range   Urine Total Volume-UCRE24 1,525 mL   Collection Interval-UCRE24 24 hours   Creatinine, Urine 68.91 mg/dL   Creatinine, 24H Ur 1,051 800 - 2,000 mg/day    Comment: Performed at [facility], [address], [place], [place] [address]  Metanephrines, urine, 24 hour     Status: None   Collection Time: 03/12/20  2:00 PM  Result Value Ref Range   Normetanephrine, Ur 239 Undefined ug/L    Comment: (NOTE) This test was developed and its performance characteristics determined by [facility]. It has not been cleared or approved by the Food and Drug Administration.    Normetanephrine, 24H Ur 364 156 - 729 ug/24 hr   Metaneph Total, Ur 50 Undefined ug/L    Comment: (NOTE) This test was developed and its performance characteristics determined by [facility]. It has not been cleared or  approved by the Food and Drug Administration.    Metanephrines, 24H Ur 76 58 - 276 ug/24 hr    Comment: (NOTE) Performed At: [facility] [place], [place] [id number] Rush Farmer MD [id number]    Total Volume 1,525     Comment: Performed at [facility], [address], [place], [place] [address]  Cortisol, urine, free     Status: None   Collection Time: 03/12/20  2:00 PM  Result Value Ref Range   Cortisol (Ur), Free 15 5 - 64 ug/24 hr    Comment: (NOTE) Performed At: [facility] [place], [place] [id number] Rush Farmer MD [id number]    Total Volume 1,525     Comment: Performed at [facility], [address] [place], [place] [address]   Cortisol,F,ug/L,U 10 Undefined ug/L    Comment: (NOTE) This test was developed and its performance characteristics determined by [facility]. It has not been cleared or approved by the Food and Drug Administration.   SARS CORONAVIRUS 2 (TAT 6-24 HRS) Nasopharyngeal Nasopharyngeal Swab     Status: None   Collection Time: 03/26/20  8:07 AM   Specimen: Nasopharyngeal Swab  Result Value Ref Range   SARS Coronavirus 2 NEGATIVE NEGATIVE    Comment: (NOTE) SARS-CoV-2 target nucleic acids are NOT DETECTED.  The SARS-CoV-2 RNA is generally detectable in upper and lower respiratory specimens during the acute phase of infection. Negative results do not preclude SARS-CoV-2 infection, do not rule out co-infections with other pathogens, and should not be used as the sole basis for treatment or other patient management decisions. Negative results must be combined with clinical observations, patient history, and epidemiological information. The expected result is Negative.  Fact Sheet for Patients: SugarRoll.be  Fact Sheet for Healthcare Providers: https://www.woods-mathews.com/  This test is not yet approved or cleared by the [place] FDA and  has  been authorized for detection and/or diagnosis of SARS-CoV-2 by FDA under an  Emergency Use Authorization (EUA). This EUA will remain  in effect (meaning this test can be used) for the duration of the COVID-19 declaration under Se ction 564(b)(1) of the Act, 21 U.S.C. section 360bbb-3(b)(1), unless the authorization is terminated or revoked sooner.  Performed at [facility], [place] [address], [place], [place] [address]      Assessment & Plan:   1. Adrenal nodule ([place])  - I have reviewed his available adrenal records and clinically evaluated the patient.  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 -Patient does not have paroxysmal symptoms suggestive of pheochromocytoma nor Cushing syndrome.  His preliminary work-up for function revealed nonfunctioning lesion in all possibilities.    -In light of his chronic, heavy smoking, gross of this adrenal lesion from 2.5 to 4.5 cm in 8 years, he may benefit from surgical excision for therapeutic and diagnostic purposes. -I discussed this option in detail with him and he is in agreement.  I will arrange referral to Dr. Armandina Gemma of [place] [facility]. He will return for follow-up visit with CMP, a.m. cortisol after his possible surgery.  - I did not initiate any new prescriptions today. The patient was counseled on the dangers of tobacco use, and was advised to quit.  Reviewed strategies to maximize success, including removing cigarettes and smoking materials from environment.   - he is advised to maintain close follow up with Abran Richard, MD for primary care needs.     - Time spent on this patient care encounter:  20 minutes of which 50% was spent in  counseling and the rest reviewing  his current and  previous labs / studies and medications  doses and developing a plan for long term care. Cline Cools  participated in the discussions, expressed  understanding, and voiced agreement with the above plans.  All questions were answered to his satisfaction. he is encouraged to contact clinic should he have any questions or concerns prior to his return visit.  Follow up plan: Return in about 3 months (around 06/24/2020) for F/U with Labs after Surgery.   Glade Lloyd, MD [facility] [address] [place], [place] [address] Phone: [phone]  Fax: [phone]     03/26/2020, 5:21 PM  This note was partially dictated with voice recognition software. Similar sounding words can be transcribed inadequately or may not  be corrected upon review.

## 2020-03-26 NOTE — Patient Instructions (Signed)
Continue Bentyl as needed for abdominal pain

## 2020-03-26 NOTE — Telephone Encounter (Signed)
Pt was seen in the office today for follow up appointment and was made aware of the need to recollect specimen. Pt is also being referred to [facility] per Dr.Nida's orders.

## 2020-03-26 NOTE — H&P (View-Only) (Signed)
Matthew Nguyen, M.D. Gastroenterology & Hepatology [facility] [address] [place], [place] [address]  Primary Care Physician: Matthew Richard, MD [address] [place] [address]  I will communicate my assessment and recommendations to the referring MD via EMR. "Note: Occasional unusual wording and randomly placed punctuation marks may result from the use of speech recognition technology to transcribe this document"  Problems: 1. Duodenal ulcers 2. IBS-C 3.  Adrenal nodule  History of Present Illness: Matthew Nguyen is a 49 y.o. male old malewith history of GERD, migraines, seizures  duodenal ulcers due to NSAIDs and IBS-C, who presents for follow up of abdominal pain.  The patient was last seen on 02/15/2020. At that time, the patient was counseled to continue taking PPI twice daily, as he was scheduled to perform an EGD and colonoscopy, which she will perform tomorrow.  He was given prescription for Zofran as needed for nausea and vomiting, as well as Bentyl 10 mg as needed for abdominal pain.  He was counseled to start taking MiraLAX.  The patient was referred to endocrinology for evaluation of adrenal nodule.  Patient reports that he is feeling better.  He has some abdominal cramping twice a week, which happens when he does not eat on time.  However, he reports that the abdominal pain is much less frequent and severe than in the past.  States that Bentyl has helped with pain, he only takes it when the pain is severe. The patient denies having any heartburn, dysphagia, nausea, vomiting, fever, chills, hematochezia, melena, hematemesis, abdominal distention, abdominal pain, diarrhea, jaundice, pruritus or weight loss. He is still smoking 20 cigs a day. He is also chewing tobacco frequently.  Has not taken any NSAIDs or high dose aspirin since his last hospitalization.  Patient was seen by Dr. Dorris Fetch today, he was referred for  evaluation by surgery to undergo resection of nodule.  Past Medical History: Past Medical History:  Diagnosis Date  . GERD (gastroesophageal reflux disease)   . Migraines   . Seizure [facility])     Past Surgical History: Past Surgical History:  Procedure Laterality Date  . ESOPHAGOGASTRODUODENOSCOPY (EGD) WITH PROPOFOL N/A 01/23/2020   Procedure: ESOPHAGOGASTRODUODENOSCOPY (EGD) WITH PROPOFOL;  Surgeon: Harvel Quale, MD;  Location: [facility] END[facility] SUITE;  Service: Gastroenterology;  Laterality: N/A;  . UMBILICAL HERNIA REPAIR N/A 06/06/2018   Procedure: HERNIA REPAIR UMBILICAL ADULT WITH MESH;  Surgeon: Aviva Signs, MD;  Location: [facility] ORS;  Service: General;  Laterality: N/A;    Family History: Family History  Problem Relation Age of Onset  . Heart disease Father     Social History: Social History   Tobacco Use  Smoking Status Current Every Day Smoker  . Packs/day: 1.00  Smokeless Tobacco Current User  . Types: Chew   Social History   Substance and Sexual Activity  Alcohol Use Yes   Comment: occasionally   Social History   Substance and Sexual Activity  Drug Use Yes  . Types: Marijuana    Allergies: Allergies  Allergen Reactions  . Morphine Nausea Only    Medications: Current Outpatient Medications  Medication Sig Dispense Refill  . amitriptyline (ELAVIL) 50 MG tablet TAKE 3 TABLETS (150 MG TOTAL) BY MOUTH AT BEDTIME. 270 tablet 1  . dicyclomine (BENTYL) 10 MG capsule Take 1 capsule (10 mg total) by mouth every 8 (eight) hours as needed for spasms. 60 capsule 3  . divalproex (DEPAKOTE) 500 MG DR tablet Take 2  tablets in the morning and two tablets at night. (Patient taking differently: Take 1,000 mg by mouth in the morning and at bedtime.) 360 tablet 3  . Galcanezumab-gnlm (EMGALITY) 120 MG/ML SOAJ Inject 1 pen into the skin every 30 (thirty) days. 1 pen 11  . ondansetron (ZOFRAN) 4 MG tablet Take 1 tablet (4 mg total) by mouth every 8 (eight) hours as  needed for nausea or vomiting. 30 tablet 1  . Oxcarbazepine (TRILEPTAL) 300 MG tablet Take 2 tablets twice a day (Patient taking differently: Take 600 mg by mouth in the morning and at bedtime.) 360 tablet 3  . pantoprazole (PROTONIX) 40 MG tablet Take 1 tablet (40 mg total) by mouth 2 (two) times daily before a meal. 60 tablet 2  . polyethylene glycol (MIRALAX / GLYCOLAX) 17 g packet Take 17 g by mouth daily. (Patient taking differently: Take 17 g by mouth in the morning and at bedtime.) 14 each 0  . SUMAtriptan (IMITREX) 50 MG tablet Take 1 tablet (50 mg total) by mouth every 2 (two) hours as needed for migraine or headache. May repeat in 2 hours if headache persists or recurs. 10 tablet 11  . zonisamide (ZONEGRAN) 100 MG capsule TAKE 4 CAPSULES AT BEDTIME. (Patient taking differently: Take 400 mg by mouth at bedtime.) 360 capsule 3  . PLENVU 140 g SOLR Take 1 kit by mouth as directed.     No current facility-administered medications for this visit.    Review of Systems: GENERAL: negative for malaise, night sweats HEENT: No changes in hearing or vision, no nose bleeds or other nasal problems. NECK: Negative for lumps, goiter, pain and significant neck swelling RESPIRATORY: Negative for cough, wheezing CARDIOVASCULAR: Negative for chest pain, leg swelling, palpitations, orthopnea GI: SEE HPI MUSCULOSKELETAL: Negative for joint pain or swelling, back pain, and muscle pain. SKIN: Negative for lesions, rash PSYCH: Negative for sleep disturbance, mood disorder and recent psychosocial stressors. HEMATOLOGY Negative for prolonged bleeding, bruising easily, and swollen nodes. ENDOCRINE: Negative for cold or heat intolerance, polyuria, polydipsia and goiter. NEURO: negative for tremor, gait imbalance, syncope and seizures. The remainder of the review of systems is noncontributory.   Physical Exam: BP (!) 143/98 (BP Location: Left Arm, Patient Position: Sitting, Cuff Size: Large)   Pulse 92    Temp 98.1 F (36.7 C) (Oral)   Ht $R'5\' 7"'vJ$  (1.702 m)   Wt 158 lb (71.7 kg)   BMI 24.75 kg/m  GENERAL: The patient is AO x3, in no acute distress. HEENT: Head is normocephalic and atraumatic. EOMI are intact. Mouth is well hydrated and without lesions. NECK: Supple. No masses LUNGS: Clear to auscultation. No presence of rhonchi/wheezing/rales. Adequate chest expansion HEART: RRR, normal s1 and s2. ABDOMEN: Soft, nontender, no guarding, no peritoneal signs, and nondistended. BS +. No masses. EXTREMITIES: Without any cyanosis, clubbing, rash, lesions or edema. NEUROLOGIC: AOx3, no focal motor deficit. SKIN: no jaundice, no rashes  Imaging/Labs: as above  I personally reviewed and interpreted the available labs, imaging and endoscopic files.  Impression and Plan: Matthew Nguyen is a 49 y.o. male old malewith history of GERD, migraines, seizures  duodenal ulcers due to NSAIDs and IBS-C, who presents for follow up of abdominal pain.  The patient has presented improvement of his abdominal pain since the last time he was seen in clinic, although it is still present intermittently but with less severity.  It is very likely that the symptoms are related to a functional etiology such as IBS C as  he has had negative abdominal imaging and upper endoscopic investigations that would explain his persistent pain.  Will need to continue with Bentyl as needed to improve his symptoms as this has been effective to control them so far.  In terms of his duodenal ulcers, this is related to the intake of Goody powders in the past, which he has not been taking any more.  We will proceed with EGD to rule out any inflammatory etiology leading to his recurrent abdominal pain.  Will need to continue on PPI twice daily for now until tomorrow's evaluation.  He will also proceed with colonoscopy as scheduled in the past with screening purposes.  - Continue Bentyl as needed for abdominal pain - Continue omeprazole 40 mg BID for  now - Proceed with scheduled EGD and colonoscopy tomorrow - RTC 1 year  All questions were answered.      Harvel Quale, MD Gastroenterology and Hepatology [facility]

## 2020-03-27 ENCOUNTER — Encounter (HOSPITAL_COMMUNITY)
Admission: RE | Disposition: A | Discharge status: Home / Self Care | Payer: Self-pay | Source: Home / Self Care | Attending: Gastroenterology

## 2020-03-27 ENCOUNTER — Ambulatory Visit (HOSPITAL_COMMUNITY): Payer: Medicare HMO | Admitting: Certified Registered"

## 2020-03-27 ENCOUNTER — Encounter (HOSPITAL_COMMUNITY): Payer: Self-pay | Admitting: Gastroenterology

## 2020-03-27 ENCOUNTER — Ambulatory Visit (HOSPITAL_COMMUNITY)
Admission: RE | Admit: 2020-03-27 | Discharge: 2020-03-27 | Disposition: A | Discharge status: Home / Self Care | Payer: Medicare HMO | Attending: Gastroenterology | Admitting: Gastroenterology

## 2020-03-27 DIAGNOSIS — K449 Diaphragmatic hernia without obstruction or gangrene: Secondary | ICD-10-CM | POA: Insufficient documentation

## 2020-03-27 DIAGNOSIS — F17210 Nicotine dependence, cigarettes, uncomplicated: Secondary | ICD-10-CM | POA: Diagnosis not present

## 2020-03-27 DIAGNOSIS — Z885 Allergy status to narcotic agent status: Secondary | ICD-10-CM | POA: Insufficient documentation

## 2020-03-27 DIAGNOSIS — K2950 Unspecified chronic gastritis without bleeding: Secondary | ICD-10-CM | POA: Diagnosis not present

## 2020-03-27 DIAGNOSIS — K635 Polyp of colon: Secondary | ICD-10-CM | POA: Diagnosis not present

## 2020-03-27 DIAGNOSIS — K2970 Gastritis, unspecified, without bleeding: Secondary | ICD-10-CM | POA: Diagnosis not present

## 2020-03-27 DIAGNOSIS — Z79899 Other long term (current) drug therapy: Secondary | ICD-10-CM | POA: Insufficient documentation

## 2020-03-27 DIAGNOSIS — Z1211 Encounter for screening for malignant neoplasm of colon: Secondary | ICD-10-CM | POA: Insufficient documentation

## 2020-03-27 DIAGNOSIS — K581 Irritable bowel syndrome with constipation: Secondary | ICD-10-CM | POA: Insufficient documentation

## 2020-03-27 DIAGNOSIS — K319 Disease of stomach and duodenum, unspecified: Secondary | ICD-10-CM | POA: Insufficient documentation

## 2020-03-27 DIAGNOSIS — K3189 Other diseases of stomach and duodenum: Secondary | ICD-10-CM | POA: Diagnosis not present

## 2020-03-27 DIAGNOSIS — K219 Gastro-esophageal reflux disease without esophagitis: Secondary | ICD-10-CM | POA: Insufficient documentation

## 2020-03-27 DIAGNOSIS — K5730 Diverticulosis of large intestine without perforation or abscess without bleeding: Secondary | ICD-10-CM

## 2020-03-27 DIAGNOSIS — D123 Benign neoplasm of transverse colon: Secondary | ICD-10-CM | POA: Diagnosis not present

## 2020-03-27 DIAGNOSIS — Z8711 Personal history of peptic ulcer disease: Secondary | ICD-10-CM | POA: Insufficient documentation

## 2020-03-27 DIAGNOSIS — F17220 Nicotine dependence, chewing tobacco, uncomplicated: Secondary | ICD-10-CM | POA: Diagnosis not present

## 2020-03-27 DIAGNOSIS — D125 Benign neoplasm of sigmoid colon: Secondary | ICD-10-CM | POA: Diagnosis not present

## 2020-03-27 DIAGNOSIS — R109 Unspecified abdominal pain: Secondary | ICD-10-CM | POA: Diagnosis present

## 2020-03-27 HISTORY — PX: ESOPHAGOGASTRODUODENOSCOPY (EGD) WITH PROPOFOL: SHX5813

## 2020-03-27 HISTORY — PX: BIOPSY: SHX5522

## 2020-03-27 HISTORY — PX: COLONOSCOPY WITH PROPOFOL: SHX5780

## 2020-03-27 HISTORY — PX: POLYPECTOMY: SHX5525

## 2020-03-27 SURGERY — COLONOSCOPY WITH PROPOFOL
Anesthesia: General

## 2020-03-27 MED ORDER — LIDOCAINE HCL (PF) 2 % IJ SOLN
INTRAMUSCULAR | Status: AC
  Filled 2020-03-27: qty 10

## 2020-03-27 MED ORDER — LIDOCAINE HCL (CARDIAC) PF 100 MG/5ML IV SOSY
PREFILLED_SYRINGE | INTRAVENOUS | Status: DC | PRN
Start: 2020-03-27 — End: 2020-03-27
  Administered 2020-03-27: 100 mg via INTRAVENOUS

## 2020-03-27 MED ORDER — PROPOFOL 10 MG/ML IV BOLUS
INTRAVENOUS | Status: DC | PRN
  Administered 2020-03-27: 100 mg via INTRAVENOUS
  Administered 2020-03-27: 175 ug/kg/min via INTRAVENOUS

## 2020-03-27 MED ORDER — PROPOFOL 10 MG/ML IV BOLUS
INTRAVENOUS | Status: AC
  Filled 2020-03-27: qty 40

## 2020-03-27 MED ORDER — PROPOFOL 10 MG/ML IV BOLUS
INTRAVENOUS | Status: AC
  Filled 2020-03-27: qty 200

## 2020-03-27 MED ORDER — LACTATED RINGERS IV SOLN
INTRAVENOUS | Status: DC
  Administered 2020-03-27: 1000 mL via INTRAVENOUS

## 2020-03-27 NOTE — Op Note (Signed)
[facility] Patient Name: Matthew Nguyen Procedure Date: 03/27/2020 7:51 AM MRN: [id number] Date of Birth: 12-30-1970 Attending MD: [name] ,  CSN: [id number] Age: 49 Admit Type: Outpatient Procedure:                Colonoscopy Indications:              Screening for colorectal malignant neoplasm Providers:                [name], Janeece Riggers, RN, Caprice Kluver, Casimer Bilis, Technician Referring MD:              Medicines:                Monitored Anesthesia Care Complications:            No immediate complications. Estimated Blood Loss:      Procedure:                Pre-Anesthesia Assessment:                           - Prior to the procedure, a History and Physical                            was performed, and patient medications, allergies                            and sensitivities were reviewed. The patient's                            tolerance of previous anesthesia was reviewed.                           - The risks and benefits of the procedure and the                            sedation options and risks were discussed with the                            patient. All questions were answered and informed                            consent was obtained.                           - ASA Grade Assessment: II - A patient with mild                            systemic disease.                           After obtaining informed consent, the colonoscope                            was passed under direct vision. Throughout the  procedure, the patient's blood pressure, pulse, and                            oxygen saturations were monitored continuously. The                            [id number]) was introduced through the anus                            and advanced to the the cecum, identified by                            appendiceal orifice and ileocecal valve. The                             colonoscopy was technically difficult and complex                            due to constant colonic peristalsis. The patient                            tolerated the procedure well. Scope withdrawal time                            was 15 minutes. The quality of the bowel                            preparation was excellent. Scope In: 7:55:20 AM Scope Out: 8:40:40 AM Scope Withdrawal Time: 0 hours 41 minutes 41 seconds  Total Procedure Duration: 0 hours 45 minutes 20 seconds  Findings:      The perianal and digital rectal examinations were normal.      A 10 mm polyp was found in the transverse colon. The polyp was       semi-sessile. The polyp was removed with a cold snare in a piecemeal       fashion due to frequent peristalsis. Resection and retrieval were       complete.      A 2 mm polyp was found in the transverse colon. The polyp was sessile.       The polyp was removed with a cold biopsy forceps. Resection and       retrieval were complete.      Three sessile polyps were found in the sigmoid colon. The polyps were 5       to 6 mm in size. These polyps were removed with a cold snare. Resection       and retrieval were complete.      Multiple small and large-mouthed diverticula were found in the sigmoid       colon.      The retroflexed view of the distal rectum and anal verge was normal and       showed no anal or rectal abnormalities. Impression:               - One 10 mm polyp in the transverse colon, removed  with a cold snare. Resected and retrieved.                           - One 2 mm polyp in the transverse colon, removed                            with a cold biopsy forceps. Resected and retrieved.                           - Three 5 to 6 mm polyps in the sigmoid colon,                            removed with a cold snare. Resected and retrieved.                           - Diverticulosis in the sigmoid colon.                           - The  distal rectum and anal verge are normal on                            retroflexion view. Moderate Sedation:      Per Anesthesia Care Recommendation:           - Discharge patient to home (ambulatory).                           - Resume previous diet.                           - Await pathology results.                           - Repeat colonoscopy in 2 years for surveillance. Procedure Code(s):        --- Professional ---                           [phone], GC, Colonoscopy, flexible; with removal of                            tumor(s), polyp(s), or other lesion(s) by snare                            technique                           76546, 99, Colonoscopy, flexible; with biopsy,                            single or multiple Diagnosis Code(s):        --- Professional ---                           K63.5, Polyp of colon  Z12.11, Encounter for screening for malignant                            neoplasm of colon                           K57.30, Diverticulosis of large intestine without                            perforation or abscess without bleeding CPT copyright 2019 American Medical Association. All rights reserved. The codes documented in this report are preliminary and upon coder review may  be revised to meet current compliance requirements. [name], MD [name],  03/27/2020 8:50:36 AM This report has been signed electronically. Number of Addenda: 0

## 2020-03-27 NOTE — Interval H&P Note (Signed)
History and Physical Interval Note:  03/27/2020 7:30 AM  Matthew Nguyen is a 49 y.o. male old male with history of GERD, migraines, seizures  duodenal ulcers due to NSAIDs, adrenal nodule  and IBS-C, who presents to the hospital for follow-up of abdominal pain and colorectal cancer screening.  Patient seen yesterday, reports having occasional abdominal cramping in his lower abdomen that has been improving with the use of Bentyl as needed. Her pain has also improved with intake of MiraLAX.  Denies any melena, hematochezia, nausea, vomiting, fever, chills, any change in his weight.  Has avoided NSAIDs.  Patient has never had a colonoscopy in the past.  No family history of colorectal cancer.  BP (!) 146/100   Pulse (!) 101   Temp 98.7 F (37.1 C) (Oral)   Resp 20   Ht 5\' 7"  (1.702 m)   Wt 72.1 kg   SpO2 99%   BMI 24.90 kg/m   GENERAL: The patient is AO x3, in no acute distress. HEENT: Head is normocephalic and atraumatic. EOMI are intact. Mouth is well hydrated and without lesions. NECK: Supple. No masses LUNGS: Clear to auscultation. No presence of rhonchi/wheezing/rales. Adequate chest expansion HEART: RRR, normal s1 and s2. ABDOMEN: Soft, nontender, no guarding, no peritoneal signs, and nondistended. BS +. No masses. EXTREMITIES: Without any cyanosis, clubbing, rash, lesions or edema. NEUROLOGIC: AOx3, no focal motor deficit. SKIN: no jaundice, no rashes   Matthew Nguyen  has presented today for surgery, with the diagnosis of abdominal pain, colitis.  The various methods of treatment have been discussed with the patient and family. After consideration of risks, benefits and other options for treatment, the patient has consented to  Procedure(s) with comments: COLONOSCOPY WITH PROPOFOL (N/A) - 730 ESOPHAGOGASTRODUODENOSCOPY (EGD) WITH PROPOFOL (N/A) as a surgical intervention.  The patient's history has been reviewed, patient examined, no change in status, stable for surgery.  I have  reviewed the patient's chart and labs.  Questions were answered to the patient's satisfaction.     Matthew Nguyen

## 2020-03-27 NOTE — Anesthesia Postprocedure Evaluation (Signed)
Anesthesia Post Note  Patient: Matthew Nguyen  Procedure(s) Performed: COLONOSCOPY WITH PROPOFOL (N/A ) ESOPHAGOGASTRODUODENOSCOPY (EGD) WITH PROPOFOL (N/A ) BIOPSY POLYPECTOMY  Anesthesia Type: General Level of consciousness: awake, oriented, awake and alert and patient cooperative Pain management: pain level controlled Vital Signs Assessment: post-procedure vital signs reviewed and stable Respiratory status: spontaneous breathing, respiratory function stable and nonlabored ventilation Cardiovascular status: blood pressure returned to baseline and stable Postop Assessment: no headache and no backache Anesthetic complications: no   No complications documented.   Last Vitals:  Vitals:   03/27/20 0700  BP: (!) 146/100  Pulse: (!) 101  Resp: 20  Temp: 37.1 C  SpO2: 99%    Last Pain:  Vitals:   03/27/20 0700  TempSrc: Oral  PainSc: 0-No pain                 Tacy Learn

## 2020-03-27 NOTE — Anesthesia Preprocedure Evaluation (Addendum)
Anesthesia Evaluation  Patient identified by MRN, date of birth, ID band Patient awake    Reviewed: Allergy & Precautions, NPO status , Patient's Chart, lab work & pertinent test results  History of Anesthesia Complications Negative for: history of anesthetic complications  Airway Mallampati: II  TM Distance: >3 FB Neck ROM: Full    Dental  (+) Missing, Dental Advisory Given   Pulmonary Current Smoker and Patient abstained from smoking.,    Pulmonary exam normal breath sounds clear to auscultation       Cardiovascular Exercise Tolerance: Good  Rhythm:Regular Rate:Tachycardia  24-Oct-2018 08:36:10 [facility]/ED ROUTINE RECORD Sinus tachycardia Probable anteroseptal infarct, old   Neuro/Psych  Headaches, Seizures -, Poorly Controlled,  PSYCHIATRIC DISORDERS    GI/Hepatic GERD  Medicated,(+)     substance abuse  alcohol use and marijuana use, Upper GI bleeding   Endo/Other  negative endocrine ROS  Renal/GU negative Renal ROS     Musculoskeletal negative musculoskeletal ROS (+)   Abdominal   Peds  Hematology negative hematology ROS (+)   Anesthesia Other Findings   Reproductive/Obstetrics negative OB ROS                             Anesthesia Physical  Anesthesia Plan  ASA: III  Anesthesia Plan: General   Post-op Pain Management:    Induction: Intravenous  PONV Risk Score and Plan: TIVA  Airway Management Planned: Nasal Cannula and Natural Airway  Additional Equipment:   Intra-op Plan:   Post-operative Plan:   Informed Consent: I have reviewed the patients History and Physical, chart, labs and discussed the procedure including the risks, benefits and alternatives for the proposed anesthesia with the patient or authorized representative who has indicated his/her understanding and acceptance.     Dental advisory given  Plan Discussed with:  Surgeon  Anesthesia Plan Comments:        Anesthesia Quick Evaluation

## 2020-03-27 NOTE — Op Note (Signed)
[facility] Patient Name: Matthew Nguyen Procedure Date: 03/27/2020 7:17 AM MRN: [id number] Date of Birth: [birth year] Attending MD: [name] ,  CSN: [id number] Age: 49 Admit Type: Outpatient Procedure:                Upper GI endoscopy Indications:              Abdominal pain Providers:                [name], Janeece Riggers, RN, Caprice Kluver, Casimer Bilis, Technician Referring MD:              Medicines:                Monitored Anesthesia Care Complications:            No immediate complications. Estimated Blood Loss:     Estimated blood loss: none. Procedure:                Pre-Anesthesia Assessment:                           - Prior to the procedure, a History and Physical                            was performed, and patient medications, allergies                            and sensitivities were reviewed. The patient's                            tolerance of previous anesthesia was reviewed.                           - The risks and benefits of the procedure and the                            sedation options and risks were discussed with the                            patient. All questions were answered and informed                            consent was obtained.                           - ASA Grade Assessment: II - A patient with mild                            systemic disease.                           After obtaining informed consent, the endoscope was                            passed under direct vision. Throughout the  procedure, the patient's blood pressure, pulse, and                            oxygen saturations were monitored continuously. The                            [id number]) was introduced through the mouth,                            and advanced to the second part of duodenum. The                            upper GI endoscopy was accomplished without                             difficulty. The patient tolerated the procedure                            well. Scope In: 7:40:15 AM Scope Out: 7:49:02 AM Total Procedure Duration: 0 hours 8 minutes 47 seconds  Findings:      A 1 cm hiatal hernia was present.      The exam of the esophagus was otherwise normal.      The entire examined stomach was normal. Biopsies were taken with a cold       forceps for Helicobacter pylori testing.      The examined duodenum was normal. There was presence of a scar in the       duodenal sweep. Impression:               - 1 cm hiatal hernia.                           - Normal stomach. Biopsied.                           - Normal examined duodenum with presence of scar. Moderate Sedation:      Per Anesthesia Care Recommendation:           - Discharge patient to home (ambulatory).                           - Resume previous diet.                           - Can stop omeprazole, but can restart it once a                            day if presence of heartburn.                           - Await pathology results.                           - Will check H. pylori serology. Procedure Code(s):        --- Professional ---  10932, GC, Esophagogastroduodenoscopy, flexible,                            transoral; with biopsy, single or multiple Diagnosis Code(s):        --- Professional ---                           K44.9, Diaphragmatic hernia without obstruction or                            gangrene                           R10.9, Unspecified abdominal pain CPT copyright 2019 American Medical Association. All rights reserved. The codes documented in this report are preliminary and upon coder review may  be revised to meet current compliance requirements. [name], MD [name],  03/27/2020 8:45:31 AM This report has been signed electronically. Number of Addenda: 0

## 2020-03-27 NOTE — Discharge Instructions (Signed)
You are being discharged to home.  Resume your previous diet.  We are waiting for your pathology results.  Can stop omeprazole, but can restart it once a day if presence of heartburn. Your physician has recommended a repeat colonoscopy in two years for surveillance.    Colonoscopy, Adult, Care After This sheet gives you information about how to care for yourself after your procedure. Your doctor may also give you more specific instructions. If you have problems or questions, call your doctor. What can I expect after the procedure? After the procedure, it is common to have:  A small amount of blood in your poop (stool) for 24 hours.  Some gas.  Mild cramping or bloating in your belly (abdomen). Follow these instructions at home: Eating and drinking   Drink enough fluid to keep your pee (urine) pale yellow.  Follow instructions from your doctor about what you cannot eat or drink.  Return to your normal diet as told by your doctor. Avoid heavy or fried foods that are hard to digest. Activity  Rest as told by your doctor.  Do not sit for a long time without moving. Get up to take short walks every 1-2 hours. This is important. Ask for help if you feel weak or unsteady.  Return to your normal activities as told by your doctor. Ask your doctor what activities are safe for you. To help cramping and bloating:   Try walking around.  Put heat on your belly as told by your doctor. Use the heat source that your doctor recommends, such as a moist heat pack or a heating pad. ? Put a towel between your skin and the heat source. ? Leave the heat on for 20-30 minutes. ? Remove the heat if your skin turns bright red. This is very important if you are unable to feel pain, heat, or cold. You may have a greater risk of getting burned. General instructions  For the first 24 hours after the procedure: ? Do not drive or use machinery. ? Do not sign important documents. ? Do not drink  alcohol. ? Do your daily activities more slowly than normal. ? Eat foods that are soft and easy to digest.  Take over-the-counter or prescription medicines only as told by your doctor.  Keep all follow-up visits as told by your doctor. This is important. Contact a doctor if:  You have blood in your poop 2-3 days after the procedure. Get help right away if:  You have more than a small amount of blood in your poop.  You see large clumps of tissue (blood clots) in your poop.  Your belly is swollen.  You feel like you may vomit (nauseous).  You vomit.  You have a fever.  You have belly pain that gets worse, and medicine does not help your pain. Summary  After the procedure, it is common to have a small amount of blood in your poop. You may also have mild cramping and bloating in your belly.  For the first 24 hours after the procedure, do not drive or use machinery, do not sign important documents, and do not drink alcohol.  Get help right away if you have a lot of blood in your poop, feel like you may vomit, have a fever, or have more belly pain. This information is not intended to replace advice given to you by your health care provider. Make sure you discuss any questions you have with your health care provider. Document Revised: 10/24/2018 Document  Reviewed: 10/24/2018 Elsevier Patient Education  [employer].  Diverticulosis  Diverticulosis is a condition that develops when small pouches (diverticula) form in the wall of the large intestine (colon). The colon is where water is absorbed and stool (feces) is formed. The pouches form when the inside layer of the colon pushes through weak spots in the outer layers of the colon. You may have a few pouches or many of them. The pouches usually do not cause problems unless they become inflamed or infected. When this happens, the condition is called diverticulitis. What are the causes? The cause of this condition is not  known. What increases the risk? The following factors may make you more likely to develop this condition:  Being older than age 45. Your risk for this condition increases with age. Diverticulosis is rare among people younger than age 25. By age 57, many people have it.  Eating a low-fiber diet.  Having frequent constipation.  Being overweight.  Not getting enough exercise.  Smoking.  Taking over-the-counter pain medicines, like aspirin and ibuprofen.  Having a family history of diverticulosis. What are the signs or symptoms? In most people, there are no symptoms of this condition. If you do have symptoms, they may include:  Bloating.  Cramps in the abdomen.  Constipation or diarrhea.  Pain in the lower left side of the abdomen. How is this diagnosed? Because diverticulosis usually has no symptoms, it is most often diagnosed during an exam for other colon problems. The condition may be diagnosed by:  Using a flexible scope to examine the colon (colonoscopy).  Taking an X-ray of the colon after dye has been put into the colon (barium enema).  Having a CT scan. How is this treated? You may not need treatment for this condition. Your health care provider may recommend treatment to prevent problems. You may need treatment if you have symptoms or if you previously had diverticulitis. Treatment may include:  Eating a high-fiber diet.  Taking a fiber supplement.  Taking a live bacteria supplement (probiotic).  Taking medicine to relax your colon. Follow these instructions at home: Medicines  Take over-the-counter and prescription medicines only as told by your health care provider.  If told by your health care provider, take a fiber supplement or probiotic. Constipation prevention Your condition may cause constipation. To prevent or treat constipation, you may need to:  Drink enough fluid to keep your urine pale yellow.  Take over-the-counter or prescription  medicines.  Eat foods that are high in fiber, such as beans, whole grains, and fresh fruits and vegetables.  Limit foods that are high in fat and processed sugars, such as fried or sweet foods.  General instructions  Try not to strain when you have a bowel movement.  Keep all follow-up visits as told by your health care provider. This is important. Contact a health care provider if you:  Have pain in your abdomen.  Have bloating.  Have cramps.  Have not had a bowel movement in 3 days. Get help right away if:  Your pain gets worse.  Your bloating becomes very bad.  You have a fever or chills, and your symptoms suddenly get worse.  You vomit.  You have bowel movements that are bloody or black.  You have bleeding from your rectum. Summary  Diverticulosis is a condition that develops when small pouches (diverticula) form in the wall of the large intestine (colon).  You may have a few pouches or many of them.  This  condition is most often diagnosed during an exam for other colon problems.  Treatment may include increasing the fiber in your diet, taking supplements, or taking medicines. This information is not intended to replace advice given to you by your health care provider. Make sure you discuss any questions you have with your health care provider. Document Revised: 10/27/2018 Document Reviewed: 10/27/2018 Elsevier Patient Education  [place].  Colon Polyps  Polyps are tissue growths inside the body. Polyps can grow in many places, including the large intestine (colon). A polyp may be a round bump or a mushroom-shaped growth. You could have one polyp or several. Most colon polyps are noncancerous (benign). However, some colon polyps can become cancerous over time. Finding and removing the polyps early can help prevent this. What are the causes? The exact cause of colon polyps is not known. What increases the risk? You are more likely to develop this  condition if you:  Have a family history of colon cancer or colon polyps.  Are older than 26 or older than 45 if you are African American.  Have inflammatory bowel disease, such as ulcerative colitis or Crohn's disease.  Have certain hereditary conditions, such as: ? Familial adenomatous polyposis. ? Lynch syndrome. ? Turcot syndrome. ? Peutz-Jeghers syndrome.  Are overweight.  Smoke cigarettes.  Do not get enough exercise.  Drink too much alcohol.  Eat a diet that is high in fat and red meat and low in fiber.  Had childhood cancer that was treated with abdominal radiation. What are the signs or symptoms? Most polyps do not cause symptoms. If you have symptoms, they may include:  Blood coming from your rectum when having a bowel movement.  Blood in your stool. The stool may look dark red or black.  Abdominal pain.  A change in bowel habits, such as constipation or diarrhea. How is this diagnosed? This condition is diagnosed with a colonoscopy. This is a procedure in which a lighted, flexible scope is inserted into the anus and then passed into the colon to examine the area. Polyps are sometimes found when a colonoscopy is done as part of routine cancer screening tests. How is this treated? Treatment for this condition involves removing any polyps that are found. Most polyps can be removed during a colonoscopy. Those polyps will then be tested for cancer. Additional treatment may be needed depending on the results of testing. Follow these instructions at home: Lifestyle  Maintain a healthy weight, or lose weight if recommended by your health care provider.  Exercise every day or as told by your health care provider.  Do not use any products that contain nicotine or tobacco, such as cigarettes and e-cigarettes. If you need help quitting, ask your health care provider.  If you drink alcohol, limit how much you have: ? 0-1 drink a day for women. ? 0-2 drinks a day for  men.  Be aware of how much alcohol is in your drink. In the [place] one drink equals one 12 oz bottle of beer (355 mL), one 5 oz glass of wine (148 mL), or one 1 oz shot of hard liquor (44 mL). Eating and drinking   Eat foods that are high in fiber, such as fruits, vegetables, and whole grains.  Eat foods that are high in calcium and vitamin D, such as milk, cheese, yogurt, eggs, liver, fish, and broccoli.  Limit foods that are high in fat, such as fried foods and desserts.  Limit the amount of red  meat and processed meat you eat, such as hot dogs, sausage, bacon, and lunch meats. General instructions  Keep all follow-up visits as told by your health care provider. This is important. ? This includes having regularly scheduled colonoscopies. ? Talk to your health care provider about when you need a colonoscopy. Contact a health care provider if:  You have new or worsening bleeding during a bowel movement.  You have new or increased blood in your stool.  You have a change in bowel habits.  You lose weight for no known reason. Summary  Polyps are tissue growths inside the body. Polyps can grow in many places, including the colon.  Most colon polyps are noncancerous (benign), but some can become cancerous over time.  This condition is diagnosed with a colonoscopy.  Treatment for this condition involves removing any polyps that are found. Most polyps can be removed during a colonoscopy. This information is not intended to replace advice given to you by your health care provider. Make sure you discuss any questions you have with your health care provider. Document Revised: 07/15/2017 Document Reviewed: 07/15/2017 Elsevier Patient Education  [place], Adult, Care After This sheet gives you information about how to care for yourself after your procedure. Your health care provider may also give you more specific instructions. If you have problems or  questions, contact your health care provider. What can I expect after the procedure? After the procedure, it is common to have:  A sore throat.  Mild stomach pain or discomfort.  Bloating.  Nausea. Follow these instructions at home:   Follow instructions from your health care provider about what to eat or drink after your procedure.  Return to your normal activities as told by your health care provider. Ask your health care provider what activities are safe for you.  Take over-the-counter and prescription medicines only as told by your health care provider.  Do not drive for 24 hours if you were given a sedative during your procedure.  Keep all follow-up visits as told by your health care provider. This is important. Contact a health care provider if you have:  A sore throat that lasts longer than one day.  Trouble swallowing. Get help right away if:  You vomit blood or your vomit looks like coffee grounds.  You have: ? A fever. ? Bloody, black, or tarry stools. ? A severe sore throat or you cannot swallow. ? Difficulty breathing. ? Severe pain in your chest or abdomen. Summary  After the procedure, it is common to have a sore throat, mild stomach discomfort, bloating, and nausea.  Do not drive for 24 hours if you were given a sedative during the procedure.  Follow instructions from your health care provider about what to eat or drink after your procedure.  Return to your normal activities as told by your health care provider. This information is not intended to replace advice given to you by your health care provider. Make sure you discuss any questions you have with your health care provider. Document Revised: 09/21/2017 Document Reviewed: 08/30/2017 Elsevier Patient Education  [place].  Hiatal Hernia  A hiatal hernia occurs when part of the stomach slides above the muscle that separates the abdomen from the chest (diaphragm). A person can be born  with a hiatal hernia (congenital), or it may develop over time. In almost all cases of hiatal hernia, only the top part of the stomach pushes through the diaphragm. Many people have  a hiatal hernia with no symptoms. The larger the hernia, the more likely it is that you will have symptoms. In some cases, a hiatal hernia allows stomach acid to flow back into the tube that carries food from your mouth to your stomach (esophagus). This may cause heartburn symptoms. Severe heartburn symptoms may mean that you have developed a condition called gastroesophageal reflux disease (GERD). What are the causes? This condition is caused by a weakness in the opening (hiatus) where the esophagus passes through the diaphragm to attach to the upper part of the stomach. A person may be born with a weakness in the hiatus, or a weakness can develop over time. What increases the risk? This condition is more likely to develop in:  Older people. Age is a major risk factor for a hiatal hernia, especially if you are over the age of 34.  Pregnant women.  People who are overweight.  People who have frequent constipation. What are the signs or symptoms? Symptoms of this condition usually develop in the form of GERD symptoms. Symptoms include:  Heartburn.  Belching.  Indigestion.  Trouble swallowing.  Coughing or wheezing.  Sore throat.  Hoarseness.  Chest pain.  Nausea and vomiting. How is this diagnosed? This condition may be diagnosed during testing for GERD. Tests that may be done include:  X-rays of your stomach or chest.  An upper gastrointestinal (GI) series. This is an X-ray exam of your GI tract that is taken after you swallow a chalky liquid that shows up clearly on the X-ray.  Endoscopy. This is a procedure to look into your stomach using a thin, flexible tube that has a tiny camera and light on the end of it. How is this treated? This condition may be treated by:  Dietary and lifestyle  changes to help reduce GERD symptoms.  Medicines. These may include: ? Over-the-counter antacids. ? Medicines that make your stomach empty more quickly. ? Medicines that block the production of stomach acid (H2 blockers). ? Stronger medicines to reduce stomach acid (proton pump inhibitors).  Surgery to repair the hernia, if other treatments are not helping. If you have no symptoms, you may not need treatment. Follow these instructions at home: Lifestyle and activity  Do not use any products that contain nicotine or tobacco, such as cigarettes and e-cigarettes. If you need help quitting, ask your health care provider.  Try to achieve and maintain a healthy body weight.  Avoid putting pressure on your abdomen. Anything that puts pressure on your abdomen increases the amount of acid that may be pushed up into your esophagus. ? Avoid bending over, especially after eating. ? Raise the head of your bed by putting blocks under the legs. This keeps your head and esophagus higher than your stomach. ? Do not wear tight clothing around your chest or stomach. ? Try not to strain when having a bowel movement, when urinating, or when lifting heavy objects. Eating and drinking  Avoid foods that can worsen GERD symptoms. These may include: ? Fatty foods, like fried foods. ? Citrus fruits, like oranges or lemon. ? Other foods and drinks that contain acid, like orange juice or tomatoes. ? Spicy food. ? Chocolate.  Eat frequent small meals instead of three large meals a day. This helps prevent your stomach from getting too full. ? Eat slowly. ? Do not lie down right after eating. ? Do not eat 1-2 hours before bed.  Do not drink beverages with caffeine. These include cola, coffee, cocoa,  and tea.  Do not drink alcohol. General instructions  Take over-the-counter and prescription medicines only as told by your health care provider.  Keep all follow-up visits as told by your health care  provider. This is important. Contact a health care provider if:  Your symptoms are not controlled with medicines or lifestyle changes.  You are having trouble swallowing.  You have coughing or wheezing that will not go away. Get help right away if:  Your pain is getting worse.  Your pain spreads to your arms, neck, jaw, teeth, or back.  You have shortness of breath.  You sweat for no reason.  You feel sick to your stomach (nauseous) or you vomit.  You vomit blood.  You have bright red blood in your stools.  You have black, tarry stools. This information is not intended to replace advice given to you by your health care provider. Make sure you discuss any questions you have with your health care provider. Document Revised: 03/12/2017 Document Reviewed: 11/02/2016 Elsevier Patient Education  [place].

## 2020-03-27 NOTE — Transfer of Care (Signed)
Immediate Anesthesia Transfer of Care Note  Patient: Matthew Nguyen  Procedure(s) Performed: COLONOSCOPY WITH PROPOFOL (N/A ) ESOPHAGOGASTRODUODENOSCOPY (EGD) WITH PROPOFOL (N/A ) BIOPSY POLYPECTOMY  Patient Location: PACU  Anesthesia Type:General  Level of Consciousness: awake, alert , oriented and patient cooperative  Airway & Oxygen Therapy: Patient Spontanous Breathing  Post-op Assessment: Report given to RN, Post -op Vital signs reviewed and stable and Patient moving all extremities  Post vital signs: Reviewed and stable  Last Vitals:  Vitals Value Taken Time  BP    Temp    Pulse    Resp    SpO2      Last Pain:  Vitals:   03/27/20 0700  TempSrc: Oral  PainSc: 0-No pain         Complications: No complications documented.

## 2020-03-28 ENCOUNTER — Other Ambulatory Visit: Payer: Self-pay

## 2020-03-28 LAB — SURGICAL PATHOLOGY

## 2020-03-28 LAB — H. PYLORI ANTIBODY, IGG: H Pylori IgG: 0.43 Index Value (ref 0.00–0.79)

## 2020-04-01 ENCOUNTER — Other Ambulatory Visit: Payer: Self-pay | Admitting: Neurology

## 2020-04-03 ENCOUNTER — Encounter (HOSPITAL_COMMUNITY): Payer: Self-pay | Admitting: Gastroenterology

## 2020-04-08 ENCOUNTER — Ambulatory Visit: Payer: Medicare HMO | Admitting: Neurology

## 2020-04-18 ENCOUNTER — Other Ambulatory Visit: Payer: Self-pay | Admitting: Neurology

## 2020-04-30 ENCOUNTER — Other Ambulatory Visit: Payer: Self-pay | Admitting: Neurology

## 2020-06-12 ENCOUNTER — Ambulatory Visit: Payer: Self-pay | Admitting: Surgery

## 2020-06-12 NOTE — H&P (Signed)
Surgical Evaluation Referring provider: Dr. Harlow Asa, Dr. Dorris Fetch  Chief Complaint: adrenal mass  HPI: this is a very pleasant 50 year old man with a history of seizures which are not well controlled (occurring every few weeks, followed by neurology), who was referred by Dr. Dorris Fetch and Dr. Harlow Asa for consideration of robotic right adrenalectomy.  He has a enlarging right adrenal mass.  This was first identified in 2013 on a CT performed in the deep system for other reasons, and at that time it measured 2.5 cm in size.  He had another imaging study on January 22, 2020 for a suspected small bowel obstruction at any pending hospital at this point the mass had increased to 4.5 x 3.5 cm with a heterogenous appearance.  He has been evaluated by Dr. Dorris Fetch and has had a negative biochemical workup including metanephrines and cortisol.  He is not symptomatic from this mass.  However given the size and change in size/appearance over the last several years, surgical resection has been recommended for definitive diagnosis and treatment.  He is previously had an umbilical hernia repair with mesh but no other abdominal surgeries.   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  Allergies  Allergen Reactions  . Morphine Nausea Only    Past Medical History:  Diagnosis Date  . GERD (gastroesophageal reflux disease)   . Migraines   . Seizure [facility])     Past Surgical History:  Procedure Laterality Date  . BIOPSY  03/27/2020   Procedure: BIOPSY;  Surgeon: Harvel Quale, MD;  [facility];  Service: Gastroenterology;;  . COLONOSCOPY WITH PROPOFOL N/A 03/27/2020   Procedure: COLONOSCOPY WITH PROPOFOL;  Surgeon: Harvel Quale, MD;  [facility];  Service: Gastroenterology;  Laterality: N/A;  730  . ESOPHAGOGASTRODUODENOSCOPY (EGD) WITH PROPOFOL N/A 01/23/2020   Procedure: ESOPHAGOGASTRODUODENOSCOPY (EGD) WITH PROPOFOL;   Surgeon: Harvel Quale, MD;  [facility];  Service: Gastroenterology;  Laterality: N/A;  . ESOPHAGOGASTRODUODENOSCOPY (EGD) WITH PROPOFOL N/A 03/27/2020   Procedure: ESOPHAGOGASTRODUODENOSCOPY (EGD) WITH PROPOFOL;  Surgeon: Harvel Quale, MD;  [facility];  Service: Gastroenterology;  Laterality: N/A;  . POLYPECTOMY  03/27/2020   Procedure: POLYPECTOMY;  Surgeon: Harvel Quale, MD;  [facility];  Service: Gastroenterology;;  . UMBILICAL HERNIA REPAIR N/A 06/06/2018   Procedure: HERNIA REPAIR UMBILICAL ADULT WITH MESH;  Surgeon: Aviva Signs, MD;  Location: [facility];  Service: General;  Laterality: N/A;    Family History  Problem Relation Age of Onset  . Heart disease Father     Social History   Socioeconomic History  . Marital status: Single    Spouse name: Not on file  . Number of children: 0  . Years of education: Not on file  . Highest education level: Not on file  Occupational History  . Not on file  Tobacco Use  . Smoking status: Current Every Day Smoker    Packs/day: 1.00  . Smokeless tobacco: Current User    Types: Chew  Vaping Use  . Vaping Use: Never used  Substance and Sexual Activity  . Alcohol use: Yes    Comment: occasionally  . Drug use: Yes    Types: Marijuana  . Sexual activity: Yes    Partners: Female  Other Topics Concern  . Not on file  Social History Narrative   Pt lives alone in 1 story home   No children   12th grade education  Past work - Ship broker / now on disability   Social Determinants of Radio broadcast assistant Strain: Not on [employer] Insecurity: Not on file  Transportation Needs: Not on file  Physical Activity: Not on file  Stress: Not on file  Social Connections: Not on file    Current Outpatient Medications on File Prior to Visit  Medication Sig Dispense Refill  . amitriptyline (ELAVIL) 50 MG tablet TAKE 3 TABLETS (150 MG TOTAL) BY MOUTH  AT BEDTIME. 270 tablet 1  . dicyclomine (BENTYL) 10 MG capsule Take 1 capsule (10 mg total) by mouth every 8 (eight) hours as needed for spasms. 60 capsule 3  . divalproex (DEPAKOTE) 500 MG DR tablet TAKE 2 TABLETS IN THE MORNING  AND TAKE 2 TABLETS AT NIGHT 360 tablet 3  . EMGALITY 120 MG/ML SOAJ INJECT 120MG (1 PEN) INTO THE SKIN EVERY 30 DAYS 1 mL 11  . ondansetron (ZOFRAN) 4 MG tablet Take 1 tablet (4 mg total) by mouth every 8 (eight) hours as needed for nausea or vomiting. 30 tablet 1  . Oxcarbazepine (TRILEPTAL) 300 MG tablet TAKE 2 TABLETS TWICE DAILY 360 tablet 3  . polyethylene glycol (MIRALAX / GLYCOLAX) 17 g packet Take 17 g by mouth daily. (Patient taking differently: Take 17 g by mouth in the morning and at bedtime.) 14 each 0  . SUMAtriptan (IMITREX) 50 MG tablet Take 1 tablet (50 mg total) by mouth every 2 (two) hours as needed for migraine or headache. May repeat in 2 hours if headache persists or recurs. 10 tablet 11  . zonisamide (ZONEGRAN) 100 MG capsule TAKE 4 CAPSULES AT BEDTIME. (Patient taking differently: Take 400 mg by mouth at bedtime.) 360 capsule 3   No current facility-administered medications on file prior to visit.    Review of Systems: a complete, 10pt review of systems was completed with pertinent positives and negatives as documented in the HPI  Physical Exam: Weight: 163.25 lb Height: 65.5in Body Surface Area: 1.82 m Body Mass Index: 26.75 kg/m  Temp.: 98.56F  Pulse: 104 (Regular)  P.OX: 96% (Room air) BP: 120/80(Sitting, Left Arm, Standard)  Gen: A&Ox3, no distress  Eyes: lids and conjunctivae normal, no icterus. Pupils equally round and reactive to light.  Neck: supple without mass or thyromegaly Chest: respiratory effort is normal. No crepitus or tenderness on palpation of the chest. Breath sounds equal.  Cardiovascular: RRR with palpable distal pulses, no pedal edema Gastrointestinal: soft, nondistended, nontender. No mass, hepatomegaly  or splenomegaly. No hernia. Lymphatic: no lymphadenopathy in the neck or groin Muscoloskeletal: no clubbing or cyanosis of the fingers.  Strength is symmetrical throughout.  Range of motion of bilateral upper and lower extremities normal without pain, crepitation or contracture. Neuro: cranial nerves grossly intact.  Sensation intact to light touch diffusely. Psych: appropriate mood and affect, normal insight/judgment intact  Skin: warm and dry   CBC Latest Ref Rng & Units 01/23/2020 01/22/2020 01/22/2020  WBC 4.0 - 10.5 K/uL 11.5(H) 10.9(H) 16.8(H)  Hemoglobin 13.0 - 17.0 g/dL 11.4(L) 10.8(L) 12.7(L)  Hematocrit 39.0 - 52.0 % 33.7(L) 31.6(L) 37.9(L)  Platelets 150 - 400 K/uL 311 298 335    CMP Latest Ref Rng & Units 01/22/2020 07/18/2019 10/24/2018  Glucose 70 - 99 mg/dL 108(H) 129(H) 87  BUN 6 - 20 mg/dL 36(H) 8 10  Creatinine 0.61 - 1.24 mg/dL 0.94 1.14 1.04  Sodium 135 - 145 mmol/L 133(L) 127(L) 133(L)  Potassium 3.5 - 5.1 mmol/L 3.6 3.8 3.8  Chloride 98 - 111 mmol/L 99 90(L) 98  CO2 22 - 32 mmol/L 22 28 21(L)  Calcium 8.9 - 10.3 mg/dL 9.2 9.1 8.8(L)  Total Protein 6.5 - 8.1 g/dL 6.8 - 7.0  Total Bilirubin 0.3 - 1.2 mg/dL 0.5 - 0.7  Alkaline Phos 38 - 126 U/L 47 - 86  AST 15 - 41 U/L 15 - 27  ALT 0 - 44 U/L 13 - 15    Lab Results  Component Value Date   INR 1.0 01/23/2020    Imaging: No results found.   A/P: enlarging right adrenal mass, asymptomatic and no apparent metabolic activity.  Given the change in size, heterogenous appearance,  I do recommend excision for pathological evaluation.  The robotic approach would be ideal.  I met with the patient today after he had consulted with Dr. Harlow Asa. We discussed the relevant anatomy and I explained the procedure in detail using a diagram to demonstrate, and we discussed the risks of bleeding, infection, injury to intra-abdominal or retroperitoneal structures, cardiovascular/pulmonary/thromboembolic complications of general  anesthesia and surgery, as well as risk of exacerbating his seizure disorder.  Discussed that this would typically require one night in the hospital.  Questions were welcomed and answered.  We'll proceed with scheduling for robotic right adrenalectomy.     Patient Active Problem List   Diagnosis Date Noted  . IBS (irritable bowel syndrome) 03/26/2020  . Current smoker 02/27/2020  . Noninfectious gastroenteritis and colitis 02/15/2020  . History of duodenal ulcer 02/15/2020  . Adrenal nodule ([place]) 02/15/2020  . Constipation   . Abnormal CT scan, colon   . Melena 01/22/2020  . Abdominal pain 01/22/2020  . Nausea & vomiting 01/22/2020  . Guaiac + stool 01/22/2020  . Umbilical hernia without obstruction and without gangrene   . Chronic daily headache 10/27/2017  . Localization-related symptomatic epilepsy and epileptic syndromes with complex partial seizures, intractable, without status epilepticus ([place]) 05/05/2016  . Seizure-like activity ([place]) 10/29/2015  . Alcohol withdrawal ([place]) 10/19/2014  . Sleep disorder 06/21/2014  . Headache 10/31/2013  . Seizures ([place]) 10/31/2013       Romana Juniper, MD [facility], [place]  See AMION to contact appropriate on-call provider

## 2020-06-24 ENCOUNTER — Ambulatory Visit: Payer: Medicare HMO | Admitting: "Endocrinology

## 2020-07-01 ENCOUNTER — Other Ambulatory Visit: Payer: Self-pay | Admitting: Neurology

## 2020-07-08 ENCOUNTER — Telehealth (INDEPENDENT_AMBULATORY_CARE_PROVIDER_SITE_OTHER): Payer: Medicare HMO | Admitting: Neurology

## 2020-07-08 ENCOUNTER — Encounter: Payer: Self-pay | Admitting: Neurology

## 2020-07-08 VITALS — Ht 68.0 in | Wt 159.0 lb

## 2020-07-08 DIAGNOSIS — R519 Headache, unspecified: Secondary | ICD-10-CM | POA: Diagnosis not present

## 2020-07-08 DIAGNOSIS — G40009 Localization-related (focal) (partial) idiopathic epilepsy and epileptic syndromes with seizures of localized onset, not intractable, without status epilepticus: Secondary | ICD-10-CM | POA: Diagnosis not present
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
## 2020-07-11 NOTE — Progress Notes (Signed)
DUE TO COVID-19 ONLY ONE VISITOR IS ALLOWED TO COME WITH YOU AND STAY IN THE WAITING ROOM ONLY DURING PRE OP AND PROCEDURE DAY OF SURGERY. THE 1 VISITOR  MAY VISIT WITH YOU AFTER SURGERY IN YOUR PRIVATE ROOM DURING VISITING HOURS ONLY!  YOU NEED TO HAVE A COVID 19 TEST ON__4/07/2020 _____ @_______ , THIS TEST MUST BE DONE BEFORE SURGERY,  COVID TESTING SITE [address] [place] [address], IT IS ON THE RIGHT GOING OUT [address] APPROXIMATELY  2 MINUTES PAST [employer] ON THE RIGHT. ONCE YOUR COVID TEST IS COMPLETED,  PLEASE BEGIN THE QUARANTINE INSTRUCTIONS AS OUTLINED IN YOUR HANDOUT.                Matthew Nguyen  07/11/2020   Your procedure is scheduled on:  07/18/2020  Report to [facility] Main  Entrance   Report to admitting at      1100 AM     Call this number if you have problems the morning of surgery [phone]    Remember: Do not eat food , candy gum or mints :After Midnight. You may have clear liquids from midnight until 1000am     CLEAR LIQUID DIET   Foods Allowed                                                                       Coffee and tea, regular and decaf                              Plain Jell-O any favor except red or purple                                            Fruit ices (not with fruit pulp)                                      Iced Popsicles                                     Carbonated beverages, regular and diet                                    Cranberry, grape and apple juices Sports drinks like Gatorade Lightly seasoned clear broth or consume(fat free) Sugar, honey syrup   _____________________________________________________________________    BRUSH YOUR TEETH MORNING OF SURGERY AND RINSE YOUR MOUTH OUT, NO CHEWING GUM CANDY OR MINTS.     Take these medicines the morning of surgery with A SIP OF WATER:        Trileptal, depakote, protonix  DO NOT TAKE ANY DIABETIC MEDICATIONS DAY OF YOUR SURGERY  You may not have any metal on your body including hair pins and              piercings  Do not wear jewelry, make-up, lotions, powders or perfumes, deodorant             Do not wear nail polish on your fingernails.  Do not shave  48 hours prior to surgery.              Men may shave face and neck.   Do not bring valuables to the hospital. [place].  Contacts, dentures or bridgework may not be worn into surgery.  Leave suitcase in the car. After surgery it may be brought to your room.     Patients discharged the day of surgery will not be allowed to drive home. IF YOU ARE HAVING SURGERY AND GOING HOME THE SAME DAY, YOU MUST HAVE AN ADULT TO DRIVE YOU HOME AND BE WITH YOU FOR 24 HOURS. YOU MAY GO HOME BY TAXI OR UBER OR ORTHERWISE, BUT AN ADULT MUST ACCOMPANY YOU HOME AND STAY WITH YOU FOR 24 HOURS.  Name and phone number of your driver:  Special Instructions: N/A              Please read over the following fact sheets you were given: _____________________________________________________________________  [facility] - Preparing for Surgery Before surgery, you can play an important role.  Because skin is not sterile, your skin needs to be as free of germs as possible.  You can reduce the number of germs on your skin by washing with CHG (chlorahexidine gluconate) soap before surgery.  CHG is an antiseptic cleaner which kills germs and bonds with the skin to continue killing germs even after washing. Please DO NOT use if you have an allergy to CHG or antibacterial soaps.  If your skin becomes reddened/irritated stop using the CHG and inform your nurse when you arrive at Short Stay. Do not shave (including legs and underarms) for at least 48 hours prior to the first CHG shower.  You may shave your face/neck. Please follow these instructions carefully:  1.  Shower with CHG Soap the night before surgery and the  morning of  Surgery.  2.  If you choose to wash your hair, wash your hair first as usual with your  normal  shampoo.  3.  After you shampoo, rinse your hair and body thoroughly to remove the  shampoo.                           4.  Use CHG as you would any other liquid soap.  You can apply chg directly  to the skin and wash                       Gently with a scrungie or clean washcloth.  5.  Apply the CHG Soap to your body ONLY FROM THE NECK DOWN.   Do not use on face/ open                           Wound or open sores. Avoid contact with eyes, ears mouth and genitals (private parts).  Wash face,  Genitals (private parts) with your normal soap.             6.  Wash thoroughly, paying special attention to the area where your surgery  will be performed.  7.  Thoroughly rinse your body with warm water from the neck down.  8.  DO NOT shower/wash with your normal soap after using and rinsing off  the CHG Soap.                9.  Pat yourself dry with a clean towel.            10.  Wear clean pajamas.            11.  Place clean sheets on your bed the night of your first shower and do not  sleep with pets. Day of Surgery : Do not apply any lotions/deodorants the morning of surgery.  Please wear clean clothes to the hospital/surgery center.  FAILURE TO FOLLOW THESE INSTRUCTIONS MAY RESULT IN THE CANCELLATION OF YOUR SURGERY PATIENT SIGNATURE_________________________________  NURSE SIGNATURE__________________________________  ________________________________________________________________________

## 2020-07-15 ENCOUNTER — Other Ambulatory Visit: Payer: Self-pay

## 2020-07-15 ENCOUNTER — Encounter (HOSPITAL_COMMUNITY): Payer: Self-pay

## 2020-07-15 ENCOUNTER — Other Ambulatory Visit (HOSPITAL_COMMUNITY)
Admission: RE | Admit: 2020-07-15 | Discharge: 2020-07-15 | Disposition: A | Discharge status: Home / Self Care | Payer: Medicare HMO | Source: Ambulatory Visit | Attending: Surgery | Admitting: Surgery

## 2020-07-15 ENCOUNTER — Encounter (HOSPITAL_COMMUNITY): Payer: Self-pay | Admitting: Physician Assistant

## 2020-07-15 ENCOUNTER — Encounter (HOSPITAL_COMMUNITY)
Admission: RE | Admit: 2020-07-15 | Discharge: 2020-07-15 | Disposition: A | Discharge status: Home / Self Care | Payer: Medicare HMO | Source: Ambulatory Visit | Attending: Surgery | Admitting: Surgery

## 2020-07-15 ENCOUNTER — Encounter (HOSPITAL_COMMUNITY): Payer: Self-pay | Admitting: Certified Registered Nurse Anesthetist

## 2020-07-15 DIAGNOSIS — Z20822 Contact with and (suspected) exposure to covid-19: Secondary | ICD-10-CM | POA: Diagnosis not present

## 2020-07-15 DIAGNOSIS — Z01818 Encounter for other preprocedural examination: Secondary | ICD-10-CM | POA: Insufficient documentation

## 2020-07-15 DIAGNOSIS — Z01812 Encounter for preprocedural laboratory examination: Secondary | ICD-10-CM | POA: Insufficient documentation

## 2020-07-15 HISTORY — DX: Malignant (primary) neoplasm, unspecified: C80.1

## 2020-07-15 LAB — CBC WITH DIFFERENTIAL/PLATELET
Abs Immature Granulocytes: 0.11 10*3/uL — ABNORMAL HIGH (ref 0.00–0.07)
Basophils Absolute: 0.1 10*3/uL (ref 0.0–0.1)
Basophils Relative: 1 %
Eosinophils Absolute: 0.2 10*3/uL (ref 0.0–0.5)
Eosinophils Relative: 2 %
HCT: 40.7 % (ref 39.0–52.0)
Hemoglobin: 13.8 g/dL (ref 13.0–17.0)
Immature Granulocytes: 1 %
Lymphocytes Relative: 12 %
Lymphs Abs: 1.3 10*3/uL (ref 0.7–4.0)
MCH: 30.4 pg (ref 26.0–34.0)
MCHC: 33.9 g/dL (ref 30.0–36.0)
MCV: 89.6 fL (ref 80.0–100.0)
Monocytes Absolute: 1.3 10*3/uL — ABNORMAL HIGH (ref 0.1–1.0)
Monocytes Relative: 12 %
Neutro Abs: 8 10*3/uL — ABNORMAL HIGH (ref 1.7–7.7)
Neutrophils Relative %: 72 %
Platelets: 354 10*3/uL (ref 150–400)
RBC: 4.54 MIL/uL (ref 4.22–5.81)
RDW: 15.2 % (ref 11.5–15.5)
WBC: 10.9 10*3/uL — ABNORMAL HIGH (ref 4.0–10.5)
nRBC: 0 % (ref 0.0–0.2)

## 2020-07-15 LAB — BASIC METABOLIC PANEL
Anion gap: 10 (ref 5–15)
BUN: 19 mg/dL (ref 6–20)
CO2: 26 mmol/L (ref 22–32)
Calcium: 9.3 mg/dL (ref 8.9–10.3)
Chloride: 93 mmol/L — ABNORMAL LOW (ref 98–111)
Creatinine, Ser: 1.44 mg/dL — ABNORMAL HIGH (ref 0.61–1.24)
GFR, Estimated: 59 mL/min — ABNORMAL LOW (ref >60)
Glucose, Bld: 123 mg/dL — ABNORMAL HIGH (ref 70–99)
Potassium: 4.7 mmol/L (ref 3.5–5.1)
Sodium: 129 mmol/L — ABNORMAL LOW (ref 135–145)

## 2020-07-15 NOTE — Progress Notes (Addendum)
      Anesthesia Review:  PCP:  DR Abran Richard  Cardiologist :NONE  Chest x-ray : EKG : 07/15/20 Echo : Stress test: Cardiac Cath :  Activity level: can do a flight of stairs without difficulty  Sleep Study/ CPAP : none  Fasting Blood Sugar :      / Checks Blood Sugar -- times a day:   Blood Thinner/ Instructions /Last Dose: ASA / Instructions/ Last Dose :  Hx of seizures- last seizure was week of 07/08/20 per pt  07/08/20- Neuro Video Visit- Dr Delice Lesch  BMP done 07/15/20 routed to Dr Kae Heller.

## 2020-07-16 LAB — SARS CORONAVIRUS 2 (TAT 6-24 HRS): SARS Coronavirus 2: NEGATIVE

## 2020-07-17 MED ORDER — BUPIVACAINE LIPOSOME 1.3 % IJ SUSP
20.0000 mL | INTRAMUSCULAR | Status: DC
  Filled 2020-07-17: qty 20

## 2020-07-18 ENCOUNTER — Ambulatory Visit (HOSPITAL_COMMUNITY)
Admission: RE | Admit: 2020-07-18 | Discharge: 2020-07-18 | Disposition: A | Discharge status: Still In Hospital | Payer: Medicare HMO | Attending: Surgery | Admitting: Surgery

## 2020-07-18 ENCOUNTER — Inpatient Hospital Stay (HOSPITAL_COMMUNITY)
Admission: EM | Admit: 2020-07-18 | Discharge: 2020-07-20 | DRG: 101 | Disposition: A | Discharge status: Home / Self Care | Payer: Medicare HMO | Attending: Internal Medicine | Admitting: Internal Medicine

## 2020-07-18 ENCOUNTER — Other Ambulatory Visit (HOSPITAL_COMMUNITY): Payer: Self-pay

## 2020-07-18 ENCOUNTER — Encounter (HOSPITAL_COMMUNITY): Payer: Self-pay | Admitting: Emergency Medicine

## 2020-07-18 ENCOUNTER — Other Ambulatory Visit: Payer: Self-pay

## 2020-07-18 ENCOUNTER — Observation Stay (HOSPITAL_COMMUNITY): Payer: Medicare HMO

## 2020-07-18 ENCOUNTER — Emergency Department (HOSPITAL_COMMUNITY): Payer: Medicare HMO

## 2020-07-18 DIAGNOSIS — E871 Hypo-osmolality and hyponatremia: Secondary | ICD-10-CM

## 2020-07-18 DIAGNOSIS — E279 Disorder of adrenal gland, unspecified: Secondary | ICD-10-CM | POA: Diagnosis present

## 2020-07-18 DIAGNOSIS — Z8673 Personal history of transient ischemic attack (TIA), and cerebral infarction without residual deficits: Secondary | ICD-10-CM

## 2020-07-18 DIAGNOSIS — Z79899 Other long term (current) drug therapy: Secondary | ICD-10-CM

## 2020-07-18 DIAGNOSIS — W1830XA Fall on same level, unspecified, initial encounter: Secondary | ICD-10-CM | POA: Diagnosis present

## 2020-07-18 DIAGNOSIS — E278 Other specified disorders of adrenal gland: Secondary | ICD-10-CM | POA: Diagnosis present

## 2020-07-18 DIAGNOSIS — F17220 Nicotine dependence, chewing tobacco, uncomplicated: Secondary | ICD-10-CM | POA: Diagnosis present

## 2020-07-18 DIAGNOSIS — F1010 Alcohol abuse, uncomplicated: Secondary | ICD-10-CM

## 2020-07-18 DIAGNOSIS — K219 Gastro-esophageal reflux disease without esophagitis: Secondary | ICD-10-CM | POA: Diagnosis present

## 2020-07-18 DIAGNOSIS — R569 Unspecified convulsions: Secondary | ICD-10-CM

## 2020-07-18 DIAGNOSIS — E876 Hypokalemia: Secondary | ICD-10-CM | POA: Diagnosis not present

## 2020-07-18 DIAGNOSIS — G40219 Localization-related (focal) (partial) symptomatic epilepsy and epileptic syndromes with complex partial seizures, intractable, without status epilepticus: Principal | ICD-10-CM | POA: Diagnosis present

## 2020-07-18 DIAGNOSIS — G43909 Migraine, unspecified, not intractable, without status migrainosus: Secondary | ICD-10-CM | POA: Diagnosis present

## 2020-07-18 DIAGNOSIS — Z9114 Patient's other noncompliance with medication regimen: Secondary | ICD-10-CM

## 2020-07-18 DIAGNOSIS — Z20822 Contact with and (suspected) exposure to covid-19: Secondary | ICD-10-CM | POA: Diagnosis present

## 2020-07-18 DIAGNOSIS — S0990XA Unspecified injury of head, initial encounter: Secondary | ICD-10-CM

## 2020-07-18 DIAGNOSIS — M25522 Pain in left elbow: Secondary | ICD-10-CM | POA: Diagnosis present

## 2020-07-18 DIAGNOSIS — Z885 Allergy status to narcotic agent status: Secondary | ICD-10-CM

## 2020-07-18 DIAGNOSIS — Z8249 Family history of ischemic heart disease and other diseases of the circulatory system: Secondary | ICD-10-CM

## 2020-07-18 LAB — COMPREHENSIVE METABOLIC PANEL
ALT: 27 U/L (ref 0–44)
AST: 34 U/L (ref 15–41)
Albumin: 4.4 g/dL (ref 3.5–5.0)
Alkaline Phosphatase: 88 U/L (ref 38–126)
Anion gap: 11 (ref 5–15)
BUN: 11 mg/dL (ref 6–20)
CO2: 21 mmol/L — ABNORMAL LOW (ref 22–32)
Calcium: 9 mg/dL (ref 8.9–10.3)
Chloride: 91 mmol/L — ABNORMAL LOW (ref 98–111)
Creatinine, Ser: 0.96 mg/dL (ref 0.61–1.24)
GFR, Estimated: >60 mL/min (ref >60)
Glucose, Bld: 80 mg/dL (ref 70–99)
Potassium: 4.6 mmol/L (ref 3.5–5.1)
Sodium: 123 mmol/L — ABNORMAL LOW (ref 135–145)
Total Bilirubin: 0.8 mg/dL (ref 0.3–1.2)
Total Protein: 7.6 g/dL (ref 6.5–8.1)

## 2020-07-18 LAB — VALPROIC ACID LEVEL: Valproic Acid Lvl: <10 ug/mL — ABNORMAL LOW (ref 50.0–100.0)

## 2020-07-18 LAB — RAPID URINE DRUG SCREEN, HOSP PERFORMED
Amphetamines: NOT DETECTED
Barbiturates: NOT DETECTED
Benzodiazepines: NOT DETECTED
Cocaine: NOT DETECTED
Opiates: NOT DETECTED
Tetrahydrocannabinol: POSITIVE — AB

## 2020-07-18 LAB — URINALYSIS, ROUTINE W REFLEX MICROSCOPIC
Bilirubin Urine: NEGATIVE
Glucose, UA: NEGATIVE mg/dL
Hgb urine dipstick: NEGATIVE
Ketones, ur: 20 mg/dL — AB
Leukocytes,Ua: NEGATIVE
Nitrite: NEGATIVE
Protein, ur: NEGATIVE mg/dL
Specific Gravity, Urine: 1.01 (ref 1.005–1.030)
pH: 7 (ref 5.0–8.0)

## 2020-07-18 LAB — CBC WITH DIFFERENTIAL/PLATELET
Abs Immature Granulocytes: 0.04 10*3/uL (ref 0.00–0.07)
Basophils Absolute: 0.1 10*3/uL (ref 0.0–0.1)
Basophils Relative: 1 %
Eosinophils Absolute: 0 10*3/uL (ref 0.0–0.5)
Eosinophils Relative: 0 %
HCT: 38.6 % — ABNORMAL LOW (ref 39.0–52.0)
Hemoglobin: 13.1 g/dL (ref 13.0–17.0)
Immature Granulocytes: 0 %
Lymphocytes Relative: 8 %
Lymphs Abs: 0.8 10*3/uL (ref 0.7–4.0)
MCH: 30.4 pg (ref 26.0–34.0)
MCHC: 33.9 g/dL (ref 30.0–36.0)
MCV: 89.6 fL (ref 80.0–100.0)
Monocytes Absolute: 0.7 10*3/uL (ref 0.1–1.0)
Monocytes Relative: 7 %
Neutro Abs: 8 10*3/uL — ABNORMAL HIGH (ref 1.7–7.7)
Neutrophils Relative %: 84 %
Platelets: 324 10*3/uL (ref 150–400)
RBC: 4.31 MIL/uL (ref 4.22–5.81)
RDW: 14.7 % (ref 11.5–15.5)
WBC: 9.6 10*3/uL (ref 4.0–10.5)
nRBC: 0 % (ref 0.0–0.2)

## 2020-07-18 LAB — CBG MONITORING, ED: Glucose-Capillary: 83 mg/dL (ref 70–99)

## 2020-07-18 LAB — SALICYLATE LEVEL: Salicylate Lvl: <7 mg/dL — ABNORMAL LOW (ref 7.0–30.0)

## 2020-07-18 LAB — TYPE AND SCREEN
ABO/RH(D): A POS
Antibody Screen: NEGATIVE

## 2020-07-18 LAB — CARBAMAZEPINE LEVEL, TOTAL: Carbamazepine Lvl: <2 ug/mL — ABNORMAL LOW (ref 4.0–12.0)

## 2020-07-18 LAB — SARS CORONAVIRUS 2 (TAT 6-24 HRS): SARS Coronavirus 2: NEGATIVE

## 2020-07-18 LAB — SODIUM, URINE, RANDOM: Sodium, Ur: 30 mmol/L

## 2020-07-18 LAB — ACETAMINOPHEN LEVEL: Acetaminophen (Tylenol), Serum: <10 ug/mL — ABNORMAL LOW (ref 10–30)

## 2020-07-18 LAB — MAGNESIUM: Magnesium: 1.8 mg/dL (ref 1.7–2.4)

## 2020-07-18 LAB — ETHANOL: Alcohol, Ethyl (B): <10 mg/dL (ref <10)

## 2020-07-18 MED ORDER — LORAZEPAM 2 MG/ML IJ SOLN: 1.0000 mg | INTRAMUSCULAR | Status: DC | PRN

## 2020-07-18 MED ORDER — ADULT MULTIVITAMIN W/MINERALS CH
1.0000 | ORAL_TABLET | Freq: Every day | ORAL | Status: DC
  Administered 2020-07-19 – 2020-07-20 (×2): 1 via ORAL
  Filled 2020-07-18 (×2): qty 1

## 2020-07-18 MED ORDER — SODIUM CHLORIDE 0.9 % IV SOLN: 75.0000 mL/h | INTRAVENOUS | Status: DC

## 2020-07-18 MED ORDER — ENOXAPARIN SODIUM 40 MG/0.4ML ~~LOC~~ SOLN
40.0000 mg | SUBCUTANEOUS | Status: DC
  Administered 2020-07-18 – 2020-07-19 (×2): 40 mg via SUBCUTANEOUS
  Filled 2020-07-18 (×2): qty 0.4

## 2020-07-18 MED ORDER — DIVALPROEX SODIUM 250 MG PO DR TAB
1000.0000 mg | DELAYED_RELEASE_TABLET | Freq: Two times a day (BID) | ORAL | Status: DC
  Administered 2020-07-18 – 2020-07-20 (×4): 1000 mg via ORAL
  Filled 2020-07-18 (×4): qty 4

## 2020-07-18 MED ORDER — THIAMINE HCL 100 MG/ML IJ SOLN
500.0000 mg | Freq: Three times a day (TID) | INTRAVENOUS | Status: DC
  Administered 2020-07-19 – 2020-07-20 (×5): 500 mg via INTRAVENOUS
  Filled 2020-07-18 (×6): qty 5

## 2020-07-18 MED ORDER — LEVETIRACETAM IN NACL 1000 MG/100ML IV SOLN
2000.0000 mg | Freq: Once | INTRAVENOUS | Status: AC
  Administered 2020-07-18: 2000 mg via INTRAVENOUS
  Filled 2020-07-18: qty 200

## 2020-07-18 MED ORDER — ACETAMINOPHEN 500 MG PO TABS
500.0000 mg | ORAL_TABLET | Freq: Four times a day (QID) | ORAL | Status: DC | PRN
  Administered 2020-07-18 – 2020-07-19 (×3): 500 mg via ORAL
  Filled 2020-07-18 (×3): qty 1

## 2020-07-18 MED ORDER — THIAMINE HCL 100 MG/ML IJ SOLN: 250.0000 mg | Freq: Every day | INTRAVENOUS | Status: DC

## 2020-07-18 MED ORDER — FOLIC ACID 1 MG PO TABS
1.0000 mg | ORAL_TABLET | Freq: Every day | ORAL | Status: DC
  Administered 2020-07-18 – 2020-07-20 (×3): 1 mg via ORAL
  Filled 2020-07-18 (×3): qty 1

## 2020-07-18 MED ORDER — OXCARBAZEPINE 300 MG PO TABS: 600.0000 mg | ORAL_TABLET | Freq: Two times a day (BID) | ORAL | Status: DC

## 2020-07-18 MED ORDER — AMITRIPTYLINE HCL 25 MG PO TABS
150.0000 mg | ORAL_TABLET | Freq: Every day | ORAL | Status: DC
  Administered 2020-07-18 – 2020-07-19 (×2): 150 mg via ORAL
  Filled 2020-07-18 (×2): qty 6

## 2020-07-18 MED ORDER — SODIUM CHLORIDE 0.9 % IV SOLN
2000.0000 mg | Freq: Once | INTRAVENOUS | Status: DC
  Filled 2020-07-18: qty 20

## 2020-07-18 MED ORDER — THIAMINE HCL 100 MG/ML IJ SOLN: 100.0000 mg | Freq: Every day | INTRAMUSCULAR | Status: DC

## 2020-07-18 MED ORDER — LORAZEPAM 1 MG PO TABS
1.0000 mg | ORAL_TABLET | ORAL | Status: DC | PRN
  Administered 2020-07-19: 1 mg via ORAL
  Filled 2020-07-18: qty 1

## 2020-07-18 MED ORDER — LEVETIRACETAM 500 MG PO TABS
500.0000 mg | ORAL_TABLET | Freq: Two times a day (BID) | ORAL | Status: DC
  Administered 2020-07-19 – 2020-07-20 (×3): 500 mg via ORAL
  Filled 2020-07-18 (×3): qty 1

## 2020-07-18 MED ORDER — SODIUM CHLORIDE 0.9 % IV SOLN: INTRAVENOUS | Status: AC

## 2020-07-18 MED ORDER — THIAMINE HCL 100 MG PO TABS: 100.0000 mg | ORAL_TABLET | Freq: Every day | ORAL | Status: DC

## 2020-07-18 MED ORDER — LORAZEPAM 2 MG/ML IJ SOLN: 4.0000 mg | INTRAMUSCULAR | Status: DC | PRN

## 2020-07-18 MED ORDER — VALPROATE SODIUM 100 MG/ML IV SOLN
500.0000 mg | Freq: Once | INTRAVENOUS | Status: AC
  Administered 2020-07-18: 500 mg via INTRAVENOUS
  Filled 2020-07-18: qty 5

## 2020-07-18 MED ORDER — SODIUM CHLORIDE 0.9 % IV BOLUS
1000.0000 mL | Freq: Once | INTRAVENOUS | Status: AC
  Administered 2020-07-18: 1000 mL via INTRAVENOUS

## 2020-07-18 MED ORDER — METOPROLOL TARTRATE 5 MG/5ML IV SOLN
5.0000 mg | Freq: Once | INTRAVENOUS | Status: AC
  Administered 2020-07-18: 5 mg via INTRAVENOUS
  Filled 2020-07-18: qty 5

## 2020-07-18 MED ORDER — LORAZEPAM 2 MG/ML IJ SOLN
1.0000 mg | Freq: Once | INTRAMUSCULAR | Status: AC | PRN
  Administered 2020-07-18: 1 mg via INTRAVENOUS
  Filled 2020-07-18: qty 1

## 2020-07-18 MED ORDER — ZONISAMIDE 100 MG PO CAPS
500.0000 mg | ORAL_CAPSULE | Freq: Every day | ORAL | Status: DC
  Administered 2020-07-18 – 2020-07-19 (×2): 500 mg via ORAL
  Filled 2020-07-18: qty 5
  Filled 2020-07-18: qty 1
  Filled 2020-07-18: qty 5

## 2020-07-18 NOTE — ED Notes (Signed)
Visitor called out using call bell asking for assistance. Upon arrival to room of staff pt jerking and pulling at equipment. Pt very disoriented. Visitor at bedside states that she was working on her laptop when heard a noise and looked and patient was jerking. Reports that pt's friend that stays with him a lot reports pt is acts very crazy after having a seizure.

## 2020-07-18 NOTE — ED Notes (Signed)
Johana PA made aware of seizure. Will be coming to see patient

## 2020-07-18 NOTE — Progress Notes (Signed)
Patient presented as scheduled for his planned robotic right adrenalectomy this afternoon, but unfortunately had a generalized seizure at the preop desk, fell and hit his head.  He was transferred to the emergency department for further evaluation.  I met with him around 1230 and he is lucid, his sister is at the bedside.  I discussed the situation with several of my partners and I recommend that we cancel the procedure for today, have him reconvene with his neurologist for formal preoperative clearance, and reschedule his surgery at a later date.

## 2020-07-18 NOTE — Progress Notes (Signed)
EEG, at [place], will be okay to be performed in morning.per Dr Rory Percy.

## 2020-07-18 NOTE — H&P (Signed)
History and Physical        Hospital Admission Note Date: 07/18/2020  Patient name: Matthew Nguyen Medical record number: [id number] Date of birth: 1971/03/29 Age: 50 y.o. Gender: male  PCP: Abran Richard, MD   Chief Complaint    Chief Complaint  Patient presents with  . 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  Initially, he had presented to short stay for planned robotic right adrenalectomy this afternoon but due to his seizure at the preop desk he was transferred to the ED.  Also pain along the left elbow worsened by flexion.  Patient lives alone and states that he is compliant with his medications.  Also states that he drinks 4-5 beers daily, most recent drink was yesterday afternoon.  Denies any known history of alcohol withdrawal symptoms.  States that he has at least one seizure weekly.  Today, while the ED PA was in the room with the patient, the patient had a second seizure which was witnessed by his sister at which point his hands were flexed and body became rigid and lasted several seconds.  He was given Ativan 1 mg x 1 and was noted to be disoriented.  He was given a loading dose of Depakote.  He follows closely with neurology for his poorly controlled seizures, most recently was seen on 3/28 at which point it was noted he has a history of focal to bilateral tonic-clonic epilepsy and has had several prolonged EEG studies which have all been normal however his typical events have never been captured.  His zonisamide was increased and he was continued on oxcarbazepine and Depakote at the time.  ED Course:  Afebrile, tachycardic, hemodynamically stable, on room air. Notable Labs: Sodium 123, K4.6, chloride 91, CO2 21, glucose 80, BUN 11, creatinine 0.96, magnesium 1.8, WBC 9.6, Hb 13.1, valproic acid level undetectable. Notable Imaging: CT head and left elbow XR negative. Patient received 1 L NS bolus, Depacon, Ativan 1 mg x 1.    Vitals:   07/18/20 1337 07/18/20 1400  BP: (!) 147/107 (!) 148/121  Pulse: (!) 110 (!) 109  Resp: 19 12  Temp:    SpO2: 99% 98%     Review of Systems:  Review of Systems  All other systems reviewed and are negative.   Medical/Social/Family History   Past Medical History: Past Medical History:  Diagnosis Date  . Cancer ([place])   . GERD (gastroesophageal reflux disease)   . Migraines   . Seizure [facility])     Past Surgical History:  Procedure Laterality Date  . BIOPSY  03/27/2020   Procedure: BIOPSY;  Surgeon: Harvel Quale, MD;  Location: [facility];  Service: Gastroenterology;;  . COLONOSCOPY WITH PROPOFOL N/A 03/27/2020   Procedure: COLONOSCOPY WITH PROPOFOL;  Surgeon: Harvel Quale, MD;  Location: [facility];  Service: Gastroenterology;  Laterality: N/A;  730  . ESOPHAGOGASTRODUODENOSCOPY (EGD) WITH PROPOFOL N/A 01/23/2020  Procedure: ESOPHAGOGASTRODUODENOSCOPY (EGD) WITH PROPOFOL;  Surgeon: Harvel Quale, MD;  Location: [facility];  Service: Gastroenterology;  Laterality: N/A;  . ESOPHAGOGASTRODUODENOSCOPY (EGD) WITH PROPOFOL N/A 03/27/2020   Procedure: ESOPHAGOGASTRODUODENOSCOPY (EGD) WITH PROPOFOL;  Surgeon: Harvel Quale, MD;  Location[facility];  Service: Gastroenterology;  Laterality: N/A;  . left ankle surgery    . POLYPECTOMY  03/27/2020   Procedure: POLYPECTOMY;  Surgeon: Harvel Quale, MD;  Location: [facility];  Service: Gastroenterology;;  . UMBILICAL HERNIA REPAIR N/A 06/06/2018   Procedure: HERNIA REPAIR UMBILICAL ADULT WITH MESH;  Surgeon: Aviva Signs, MD;   Location: [facility] ORS;  Service: General;  Laterality: N/A;    Medications: Prior to Admission medications   Medication Sig Start Date End Date Taking? Authorizing Provider  acetaminophen (TYLENOL) 325 MG tablet Take 650 mg by mouth every 6 (six) hours as needed for mild pain, fever or headache.   Yes [provider]  amitriptyline (ELAVIL) 50 MG tablet TAKE 3 TABLETS (150 MG TOTAL) BY MOUTH AT BEDTIME. 07/01/20  Yes Cameron Sprang, MD  divalproex (DEPAKOTE) 500 MG DR tablet TAKE 2 TABLETS IN THE MORNING  AND TAKE 2 TABLETS AT NIGHT Patient taking differently: TAKE 2 TABLETS IN THE MORNING  AND TAKE 2 TABLETS AT NIGHT 04/30/20  Yes Cameron Sprang, MD  EMGALITY 120 MG/ML SOAJ INJECT 120MG  (1 PEN) INTO THE SKIN EVERY 30 DAYS Patient taking differently: Inject 120 mg into the skin every 30 (thirty) days. 04/30/20  Yes Cameron Sprang, MD  Oxcarbazepine (TRILEPTAL) 300 MG tablet TAKE 2 TABLETS TWICE DAILY Patient taking differently: Take 300 mg by mouth 2 (two) times daily. 04/01/20  Yes Cameron Sprang, MD  SUMAtriptan (IMITREX) 50 MG tablet TAKE 1 TABLET EVERY TWO HOURS AS NEEDED FOR MIGRAINE OR HEADACHE. MAY REPEAT IN 2 HOURS IF HEADACHE PERSISTS OR RECURS Patient taking differently: Take 50 mg by mouth every 2 (two) hours as needed for migraine or headache. 07/01/20  Yes Cameron Sprang, MD  zonisamide (ZONEGRAN) 100 MG capsule TAKE 4 CAPSULES AT BEDTIME. Patient taking differently: Take 500 mg by mouth at bedtime. 07/01/20  Yes Cameron Sprang, MD  ondansetron (ZOFRAN) 4 MG tablet Take 1 tablet (4 mg total) by mouth every 8 (eight) hours as needed for nausea or vomiting. Patient not taking: No sig reported 02/15/20   Montez Morita, Quillian Quince, MD  polyethylene glycol (MIRALAX / GLYCOLAX) 17 g packet Take 17 g by mouth daily. Patient not taking: Reported on 07/18/2020 01/25/20   Kathie Dike, MD    Allergies:   Allergies  Allergen Reactions  . Morphine Nausea And Vomiting    Social  History:  reports that he quit smoking about 3 weeks ago. He smoked 1.00 pack per day. His smokeless tobacco use includes chew and snuff. He reports current alcohol use. He reports current drug use. Drug: Marijuana.  Family History: Family History  Problem Relation Age of Onset  . Heart disease Father      Objective   Physical Exam: Blood pressure (!) 148/121, pulse (!) 109, temperature 98.1 F (36.7 C), temperature source Oral, resp. rate 12, height 5\' 8"  (1.727 m), weight 72 kg, SpO2 98 %.  Physical Exam Vitals and nursing note reviewed.  Constitutional:      Appearance: Normal appearance.  HENT:     Head: Normocephalic and atraumatic.  Eyes:     Conjunctiva/sclera: Conjunctivae normal.  Cardiovascular:     Rate and Rhythm: Normal  rate and regular rhythm.  Pulmonary:     Effort: Pulmonary effort is normal.     Breath sounds: Normal breath sounds.  Abdominal:     General: Abdomen is flat.     Palpations: Abdomen is soft.  Musculoskeletal:        General: No swelling or tenderness. Normal range of motion.  Skin:    Coloration: Skin is not jaundiced or pale.  Neurological:     Mental Status: He is alert and oriented to person, place, and time.     Motor: No weakness.     Comments: Postictal  Psychiatric:        Mood and Affect: Mood normal.        Behavior: Behavior normal.     LABS on Admission: I have personally reviewed all the labs and imaging below    Basic Metabolic Panel: Recent Labs  Lab 07/15/20 1320 07/18/20 1246  NA 129* 123*  K 4.7 4.6  CL 93* 91*  CO2 26 21*  GLUCOSE 123* 80  BUN 19 11  CREATININE 1.44* 0.96  CALCIUM 9.3 9.0  MG  --  1.8   Liver Function Tests: Recent Labs  Lab 07/18/20 1246  AST 34  ALT 27  ALKPHOS 88  BILITOT 0.8  PROT 7.6  ALBUMIN 4.4   No results for input(s): LIPASE, AMYLASE in the last 168 hours. No results for input(s): AMMONIA in the last 168 hours. CBC: Recent Labs  Lab 07/15/20 1320 07/18/20 1246   WBC 10.9* 9.6  NEUTROABS 8.0* 8.0*  HGB 13.8 13.1  HCT 40.7 38.6*  MCV 89.6 89.6  PLT 354 324   Cardiac Enzymes: No results for input(s): CKTOTAL, CKMB, CKMBINDEX, TROPONINI in the last 168 hours. BNP: Invalid input(s): POCBNP CBG: Recent Labs  Lab 07/18/20 1252  GLUCAP 83    Radiological Exams on Admission:  DG Elbow Complete Left  Result Date: 07/18/2020 CLINICAL DATA:  Left olecranon pain after fall today. EXAM: LEFT ELBOW - COMPLETE 3+ VIEW COMPARISON:  None. FINDINGS: There is no evidence of fracture, dislocation, or joint effusion. There is no evidence of arthropathy or other focal bone abnormality. Soft tissues are unremarkable. Peripheral venous catheter overlies the anterior elbow. IMPRESSION: Negative left elbow radiographs. Electronically Signed   By: Dahlia Bailiff MD   On: 07/18/2020 13:43   CT Head Wo Contrast  Result Date: 07/18/2020 CLINICAL DATA:  Head trauma and seizure EXAM: CT HEAD WITHOUT CONTRAST TECHNIQUE: Contiguous axial images were obtained from the base of the skull through the vertex without intravenous contrast. COMPARISON:  Head CT October 24, 2018 and MRI brain November 19, 2018 FINDINGS: Brain: No evidence of acute large vascular territory infarction, hemorrhage, hydrocephalus, extra-axial collection or mass lesion/mass effect. Vascular: No hyperdense vessel or unexpected calcification. Skull: Normal. Negative for fracture or focal lesion. Sinuses/Orbits: The paranasal sinuses the mastoid air cells are predominantly clear. Other: Mild soft tissue swelling overlying the left posterior calvarium. IMPRESSION: No acute intracranial findings. Mild soft tissue swelling overlying the left posterior calvarium. 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 Breakthrough seizures with history of epilepsy a. Possibly secondary to hyponatremia  vs alcohol withdrawal vs poor control from AEDs b. Reportedly compliant with his medications c. Continue home Depakote 1000 mg twice daily, oxcarbazepine 600 mg twice daily and zonisamide 500 mg nightly (recently increased on 3/28) d. Seizure precautions e. Neurology consulted  2. Acute on chronic hyponatremia a. DDx includes alcohol versus poor p.o. intake versus AEDs/SIADH b. Check sodium studies c. Gentle IV fluids  3. Alcohol abuse a. Drinks 4-5 beers daily, most recent drink was yesterday b. CIWA protocol c. Advised cessation  4. Migraines a. Holding home meds  5. Left elbow pain s/p fall a. Tylenol for pain control  6. Right adrenal mass a. Outpatient follow-up with surgery and with neurology for preop clearance     DVT prophylaxis: Lovenox   Code Status: Full Code  Diet: Heart healthy Family Communication: Admission, patients condition and plan of care including tests being ordered have been discussed with the patient who indicates understanding and agrees with the plan and Code Status. Patient's sister was updated  Disposition Plan: The appropriate patient status for this patient is OBSERVATION. Observation status is judged to be reasonable and necessary in order to provide the required intensity of service to ensure the patient's safety. The patient's presenting symptoms, physical exam findings, and initial radiographic and laboratory data in the context of their medical condition is felt to place them at decreased risk for further clinical deterioration. Furthermore, it is anticipated that the patient will be medically stable for discharge from the hospital within 2 midnights of admission. The following factors support the patient status of observation.   " The patient's presenting symptoms  include seizure. " The physical exam findings include postictal. " The initial radiographic and laboratory data are hyponatremia.    Consultants  . Neurology  Procedures  . None  Time Spent on Admission: 60 minutes    Harold Hedge, DO [facility]  07/18/2020, 3:50 PM
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
## 2020-07-18 NOTE — ED Triage Notes (Addendum)
Brought over from short stay, patient had a witnessed seizure from standing, fell and hit his head, lasted about 30 seconds, hx of epilepsy. Patient is alert and oriented x4 at this time.

## 2020-07-18 NOTE — ED Provider Notes (Addendum)
[facility] DEPT Provider Note   CSN: [id number] Arrival date & time: 07/18/20  1138     History Chief Complaint  Patient presents with  . Seizures    Matthew Nguyen is a 50 y.o. male.  50 y.o male with a PMH of GERD, Seizures presents to the ED status post seizure x1 hour prior to arrival.  Patient was at short stay was scheduled for right adrenalectomy by Dr. Harlow Asa when he suddenly fell from standing, landing on the back of his head causing him to have a seizure which "lasted a couple minutes ", this history was provided by the RN at short stay who witnessed the seizure.  According to sister lives at the bedside, patient is compliant with medication, his last seizure happened last weekend, he is closely followed by Dr. Ellouise Newer, who currently has them on  oxcarbamazepine for seizure control.  Patient endorses pain along the back of his head where a goose egg is present, also endorses pain along the left elbow which is exacerbated with flexion.  He is alert and oriented x4.  His any headache, fevers, chest pain, shortness of breath or other complaints.  The history is provided by the patient and a relative.  Seizures      Past Medical History:  Diagnosis Date  . Cancer ([place])   . GERD (gastroesophageal reflux disease)   . Migraines   . Seizure [facility])     Patient Active Problem List   Diagnosis Date Noted  . Seizure ([place]) 07/18/2020  . IBS (irritable bowel syndrome) 03/26/2020  . Current smoker 02/27/2020  . Noninfectious gastroenteritis and colitis 02/15/2020  . History of duodenal ulcer 02/15/2020  . Adrenal nodule ([place]) 02/15/2020  . Constipation   . Abnormal CT scan, colon   . Melena 01/22/2020  . Abdominal pain 01/22/2020  . Nausea & vomiting 01/22/2020  . Guaiac + stool 01/22/2020  . Umbilical hernia without obstruction and without gangrene   . Chronic daily headache 10/27/2017  . Localization-related symptomatic epilepsy and  epileptic syndromes with complex partial seizures, intractable, without status epilepticus ([place]) 05/05/2016  . Seizure-like activity ([place]) 10/29/2015  . 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 Sleep disorder 06/21/2014  . Headache 10/31/2013  . Seizures ([place]) 10/31/2013    Past Surgical History:  Procedure Laterality Date  . BIOPSY  03/27/2020   Procedure: BIOPSY;  Surgeon: Harvel Quale, MD;  Location[facility];  Service: Gastroenterology;;  . COLONOSCOPY WITH PROPOFOL N/A 03/27/2020   Procedure: COLONOSCOPY WITH PROPOFOL;  Surgeon: Harvel Quale, MD;  Location: [facility];  Service: Gastroenterology;  Laterality: N/A;  730  . ESOPHAGOGASTRODUODENOSCOPY (EGD) WITH PROPOFOL N/A 01/23/2020   Procedure: ESOPHAGOGASTRODUODENOSCOPY (EGD) WITH PROPOFOL;  Surgeon: Harvel Quale, MD;  Location: [facility];  Service: Gastroenterology;  Laterality: N/A;  . ESOPHAGOGASTRODUODENOSCOPY (EGD) WITH PROPOFOL N/A 03/27/2020   Procedure: ESOPHAGOGASTRODUODENOSCOPY (EGD) WITH PROPOFOL;  Surgeon: Harvel Quale, MD;  Location[facility];  Service: Gastroenterology;  Laterality: N/A;  . left ankle surgery    . POLYPECTOMY  03/27/2020   Procedure: POLYPECTOMY;  Surgeon: Harvel Quale, MD;  Location: [facility];  Service: Gastroenterology;;  . UMBILICAL HERNIA REPAIR N/A 06/06/2018   Procedure: HERNIA REPAIR UMBILICAL ADULT WITH MESH;  Surgeon: Aviva Signs, MD;  Location: [facility] ORS;  Service: General;  Laterality: N/A;       Family History  Problem Relation Age of Onset  . Heart disease Father  Social History   Tobacco Use  . Smoking status: Former Smoker    Packs/day: 1.00    Quit date: 06/25/2020    Years since quitting: 0.0  . Smokeless tobacco: Current User    Types: Chew, Snuff  Vaping Use  . Vaping Use: Never used  Substance Use Topics  . Alcohol use: Yes    Comment: 4-6 beers daily   . Drug use: Yes    Types:  Marijuana    Comment: occas     Home Medications Prior to Admission medications   Medication Sig Start Date End Date Taking? Authorizing Provider  acetaminophen (TYLENOL) 325 MG tablet Take 650 mg by mouth every 6 (six) hours as needed for mild pain, fever or headache.   Yes [provider]  amitriptyline (ELAVIL) 50 MG tablet TAKE 3 TABLETS (150 MG TOTAL) BY MOUTH AT BEDTIME. 07/01/20  Yes Cameron Sprang, MD  divalproex (DEPAKOTE) 500 MG DR tablet TAKE 2 TABLETS IN THE MORNING  AND TAKE 2 TABLETS AT NIGHT Patient taking differently: TAKE 2 TABLETS IN THE MORNING  AND TAKE 2 TABLETS AT NIGHT 04/30/20  Yes Cameron Sprang, MD  EMGALITY 120 MG/ML SOAJ INJECT 120MG  (1 PEN) INTO THE SKIN EVERY 30 DAYS Patient taking differently: Inject 120 mg into the skin every 30 (thirty) days. 04/30/20  Yes Cameron Sprang, MD  Oxcarbazepine (TRILEPTAL) 300 MG tablet TAKE 2 TABLETS TWICE DAILY Patient taking differently: Take 300 mg by mouth 2 (two) times daily. 04/01/20  Yes Cameron Sprang, MD  SUMAtriptan (IMITREX) 50 MG tablet TAKE 1 TABLET EVERY TWO HOURS AS NEEDED FOR MIGRAINE OR HEADACHE. MAY REPEAT IN 2 HOURS IF HEADACHE PERSISTS OR RECURS Patient taking differently: Take 50 mg by mouth every 2 (two) hours as needed for migraine or headache. 07/01/20  Yes Cameron Sprang, MD  zonisamide (ZONEGRAN) 100 MG capsule TAKE 4 CAPSULES AT BEDTIME. Patient taking differently: Take 500 mg by mouth at bedtime. 07/01/20  Yes Cameron Sprang, MD  ondansetron (ZOFRAN) 4 MG tablet Take 1 tablet (4 mg total) by mouth every 8 (eight) hours as needed for nausea or vomiting. Patient not taking: No sig reported 02/15/20   Montez Morita, Quillian Quince, MD  polyethylene glycol (MIRALAX / GLYCOLAX) 17 g packet Take 17 g by mouth daily. Patient not taking: Reported on 07/18/2020 01/25/20   Kathie Dike, MD    Allergies    Morphine  Review of Systems   Review of Systems  Constitutional: Negative for fever.   Respiratory: Negative for shortness of breath.   Cardiovascular: Negative for chest pain.  Gastrointestinal: Negative for abdominal pain and vomiting.  Genitourinary: Negative for flank pain.  Skin: Positive for wound.  Neurological: Positive for seizures.  All other systems reviewed and are negative.   Physical Exam Updated Vital Signs BP (!) 148/121   Pulse (!) 109   Temp 98.1 F (36.7 C) (Oral)   Resp 12   Ht 5\' 8"  (1.727 m)   Wt 72 kg   SpO2 98%   BMI 24.14 kg/m   Physical Exam Vitals and nursing note reviewed.  Constitutional:      Appearance: Normal appearance.  HENT:     Head: Normocephalic.     Comments: Goose egg to the posterior head. Bleeding controlled.     Nose: Nose normal.     Mouth/Throat:     Mouth: Mucous membranes are moist.     Comments: No lacerations or injury to oropharynx on  mouth.  Eyes:     Pupils: Pupils are equal, round, and reactive to light.  Neck:     Comments: No pain with palpation of the cervical spine.  Cardiovascular:     Rate and Rhythm: Normal rate.  Pulmonary:     Effort: Pulmonary effort is normal.     Breath sounds: No wheezing.     Comments: Lungs are cleared to auscultation.  Abdominal:     General: Abdomen is flat.     Tenderness: There is no right CVA tenderness or left CVA tenderness.  Musculoskeletal:        General: Tenderness present. No deformity.     Left elbow: Swelling present. Normal range of motion. Tenderness present in olecranon process.     Cervical back: Normal range of motion and neck supple.     Comments: TTP along the olecranon process, small abrasion noted. Full ROM with some pain.   Skin:    General: Skin is warm and dry.  Neurological:     Mental Status: He is alert and oriented to person, place, and time.     ED Results / Procedures / Treatments   Labs (all labs ordered are listed, but only abnormal results are displayed) Labs Reviewed  CBC WITH DIFFERENTIAL/PLATELET - Abnormal; Notable  for the following components:      Result Value   HCT 38.6 (*)    Neutro Abs 8.0 (*)    All other components within normal limits  COMPREHENSIVE METABOLIC PANEL - Abnormal; Notable for the following components:   Sodium 123 (*)    Chloride 91 (*)    CO2 21 (*)    All other components within normal limits  ACETAMINOPHEN LEVEL - Abnormal; Notable for the following components:   Acetaminophen (Tylenol), Serum <10 (*)    All other components within normal limits  SALICYLATE LEVEL - Abnormal; Notable for the following components:   Salicylate Lvl <1.6 (*)    All other components within normal limits  VALPROIC ACID LEVEL - Abnormal; Notable for the following components:   Valproic Acid Lvl <10 (*)    All other components within normal limits  MAGNESIUM  ETHANOL  URINALYSIS, ROUTINE W REFLEX MICROSCOPIC  CARBAMAZEPINE LEVEL, TOTAL  RAPID URINE DRUG SCREEN, HOSP PERFORMED  CBG MONITORING, ED    EKG EKG Interpretation  Date/Time:  Thursday July 18 2020 13:00:55 EDT Ventricular Rate:  109 PR Interval:  177 QRS Duration: 122 QT Interval:  342 QTC Calculation: 461 R Axis:   80 Text Interpretation: Sinus tachycardia Nonspecific intraventricular conduction delay Nonspecific ST abnormality Confirmed by Lajean Saver [phone]) on 07/18/2020 1:16:12 PM   Radiology DG Elbow Complete Left  Result Date: 07/18/2020 CLINICAL DATA:  Left olecranon pain after fall today. EXAM: LEFT ELBOW - COMPLETE 3+ VIEW COMPARISON:  None. FINDINGS: There is no evidence of fracture, dislocation, or joint effusion. There is no evidence of arthropathy or other focal bone abnormality. Soft tissues are unremarkable. Peripheral venous catheter overlies the anterior elbow. IMPRESSION: Negative left elbow radiographs. Electronically Signed   By: Dahlia Bailiff MD   On: 07/18/2020 13:43   CT Head Wo Contrast  Result Date: 07/18/2020 CLINICAL DATA:  Head trauma and seizure EXAM: CT HEAD WITHOUT CONTRAST TECHNIQUE:  Contiguous axial images were obtained from the base of the skull through the vertex without intravenous contrast. COMPARISON:  Head CT October 24, 2018 and MRI brain November 19, 2018 FINDINGS: Brain: No evidence of acute large vascular territory infarction, hemorrhage, hydrocephalus,  extra-axial collection or mass lesion/mass effect. Vascular: No hyperdense vessel or unexpected calcification. Skull: Normal. Negative for fracture or focal lesion. Sinuses/Orbits: The paranasal sinuses the mastoid air cells are predominantly clear. Other: Mild soft tissue swelling overlying the left posterior calvarium. IMPRESSION: No acute intracranial findings. Mild soft tissue swelling overlying the left posterior calvarium. Electronically Signed   By: Dahlia Bailiff MD   On: 07/18/2020 13:48    Procedures Procedures   Medications Ordered in ED Medications  valproate (DEPACON) 500 mg in dextrose 5 % 50 mL IVPB (0 mg Intravenous Stopped 07/18/20 1518)  sodium chloride 0.9 % bolus 1,000 mL (1,000 mLs Intravenous Bolus from Bag 07/18/20 1421)  LORazepam (ATIVAN) injection 1 mg (1 mg Intravenous Given 07/18/20 1417)    ED Course  I have reviewed the triage vital signs and the nursing notes.  Pertinent labs & imaging results that were available during my care of the patient were reviewed by me and considered in my medical decision making (see chart for details).  Clinical Course as of 07/18/20 1520  Thu Jul 18, 2020  1342 Sodium(!): 123 [JS]    Clinical Course User Index [JS] Janeece Fitting, PA-C   MDM Rules/Calculators/A&P   Patient with a past medical history of seizure presents to the ED status post seizure, witnessed by nursing staff about an hour prior to arrival.  Patient had a procedure scheduled with Dr. Harlow Asa for a right adrenalectomy when he suddenly fell from standing hitting the back of his head and having a seizure which lasted a couple of minutes.  Last seizure before today was last week and witnessed by a  friend.  He is on several medication including  oxcarbazepine 600mg  BID, Zonisamide 400mg  qhs, and Depakote 1000mg  BID without side effect followed by neurology Dr. Delice Lesch, last seen about a week and a half ago via telehealth.  Reports compliance with medication, denies any infectious process such as fever, neck pain, chest pain, shortness of breath.  According to his chart, surgery was supposed to be performed by Dr. Kae Heller, according to her note he will need preoperative clearance prior to rescheduling his surgery.  Patient was provided with a loading dose of Depakote 500 mg via IV.  CMP remarkable for moderate hyponatremia at a 123 level compared to his previous 1 3 days ago with a six-point drop.  Decrease in chloride, other electrolyte derangement.  Creatinine level is within normal limits.  His CBC is without any leukocytosis, hemoglobin is within normal limits.  Ethanol, acetaminophen level within normal.  Magnesium levels normal, salicylate level normal.  His CBG on arrival was 83.  X-ray of his left elbow did not show any fractures, or acute findings. CT Head showed: No acute intracranial findings.    Mild soft tissue swelling overlying the left posterior calvarium.     These results were discussed at length with patient along with sister list at the bedside.  He was informed of his lower sodium level, reports labs obtained 3 days ago where for a preop.  He does endorse alcohol drinking, states he drinks 1-2 beers daily but no more than that, does endorse smoking cigarettes.  2:29 PM I was called to patient bedside, as he had a second seizure in the room, witnessed by his sister lives with her hands were flexed, body became rigid.  This lasted a couple seconds.  He was given 1 of Ativan.  He is mumbling stuff, not making any sense, disoriented from her prior evaluation.  Patient received loading dose of Depakote, will admit patient for second seizure episode at this time along with  moderate hyponatremia.   I have discussed case with my attending Dr. Cordelia Pen who agrees with plan and management.  2:42 PM Spoke to Dr. Christena Flake, who will admit patient for further management. I will personally place call to Neurology for their recommendations.   3:19 PM Spoke to Dr. Berneice Gandy neurology who will evaluate patient during his admission.   Portions of this note were generated with [employer]. Dictation errors may occur despite best attempts at proofreading.  Final Clinical Impression(s) / ED Diagnoses Final diagnoses:  Seizure ([place])  Injury of head, initial encounter  Left elbow pain  Hyponatremia    Rx / DC Orders ED Discharge Orders    None       Janeece Fitting, PA-C 07/18/20 1443    Janeece Fitting, PA-C 07/18/20 1520    Lajean Saver, MD 07/20/20 2223

## 2020-07-18 NOTE — ED Notes (Signed)
Seizures pads placed on bed.

## 2020-07-19 ENCOUNTER — Inpatient Hospital Stay (HOSPITAL_COMMUNITY)
Admission: EM | Admit: 2020-07-19 | Discharge: 2020-07-19 | Disposition: A | Discharge status: Home / Self Care | Payer: Medicare HMO | Source: Home / Self Care | Attending: Internal Medicine | Admitting: Internal Medicine

## 2020-07-19 DIAGNOSIS — E278 Other specified disorders of adrenal gland: Secondary | ICD-10-CM | POA: Diagnosis not present

## 2020-07-19 DIAGNOSIS — E871 Hypo-osmolality and hyponatremia: Secondary | ICD-10-CM | POA: Diagnosis present

## 2020-07-19 DIAGNOSIS — Z9114 Patient's other noncompliance with medication regimen: Secondary | ICD-10-CM | POA: Diagnosis not present

## 2020-07-19 DIAGNOSIS — K219 Gastro-esophageal reflux disease without esophagitis: Secondary | ICD-10-CM | POA: Diagnosis present

## 2020-07-19 DIAGNOSIS — S0990XA Unspecified injury of head, initial encounter: Secondary | ICD-10-CM

## 2020-07-19 DIAGNOSIS — G43909 Migraine, unspecified, not intractable, without status migrainosus: Secondary | ICD-10-CM | POA: Diagnosis present

## 2020-07-19 DIAGNOSIS — R569 Unspecified convulsions: Secondary | ICD-10-CM | POA: Diagnosis not present

## 2020-07-19 DIAGNOSIS — Z8673 Personal history of transient ischemic attack (TIA), and cerebral infarction without residual deficits: Secondary | ICD-10-CM | POA: Diagnosis not present

## 2020-07-19 DIAGNOSIS — M25522 Pain in left elbow: Secondary | ICD-10-CM | POA: Diagnosis present

## 2020-07-19 DIAGNOSIS — W1830XA Fall on same level, unspecified, initial encounter: Secondary | ICD-10-CM | POA: Diagnosis present

## 2020-07-19 DIAGNOSIS — F1010 Alcohol abuse, uncomplicated: Secondary | ICD-10-CM | POA: Diagnosis not present

## 2020-07-19 DIAGNOSIS — F17220 Nicotine dependence, chewing tobacco, uncomplicated: Secondary | ICD-10-CM | POA: Diagnosis present

## 2020-07-19 DIAGNOSIS — Z79899 Other long term (current) drug therapy: Secondary | ICD-10-CM | POA: Diagnosis not present

## 2020-07-19 DIAGNOSIS — Z885 Allergy status to narcotic agent status: Secondary | ICD-10-CM | POA: Diagnosis not present

## 2020-07-19 DIAGNOSIS — Z20822 Contact with and (suspected) exposure to covid-19: Secondary | ICD-10-CM | POA: Diagnosis present

## 2020-07-19 DIAGNOSIS — Z8249 Family history of ischemic heart disease and other diseases of the circulatory system: Secondary | ICD-10-CM | POA: Diagnosis not present

## 2020-07-19 DIAGNOSIS — G40219 Localization-related (focal) (partial) symptomatic epilepsy and epileptic syndromes with complex partial seizures, intractable, without status epilepticus: Secondary | ICD-10-CM | POA: Diagnosis present

## 2020-07-19 DIAGNOSIS — E876 Hypokalemia: Secondary | ICD-10-CM | POA: Diagnosis not present

## 2020-07-19 DIAGNOSIS — E279 Disorder of adrenal gland, unspecified: Secondary | ICD-10-CM | POA: Diagnosis present

## 2020-07-19 LAB — COMPREHENSIVE METABOLIC PANEL
ALT: 28 U/L (ref 0–44)
AST: 37 U/L (ref 15–41)
Albumin: 3.7 g/dL (ref 3.5–5.0)
Alkaline Phosphatase: 83 U/L (ref 38–126)
Anion gap: 9 (ref 5–15)
BUN: 8 mg/dL (ref 6–20)
CO2: 20 mmol/L — ABNORMAL LOW (ref 22–32)
Calcium: 8.6 mg/dL — ABNORMAL LOW (ref 8.9–10.3)
Chloride: 99 mmol/L (ref 98–111)
Creatinine, Ser: 0.95 mg/dL (ref 0.61–1.24)
GFR, Estimated: >60 mL/min (ref >60)
Glucose, Bld: 79 mg/dL (ref 70–99)
Potassium: 3.7 mmol/L (ref 3.5–5.1)
Sodium: 128 mmol/L — ABNORMAL LOW (ref 135–145)
Total Bilirubin: 1 mg/dL (ref 0.3–1.2)
Total Protein: 6.7 g/dL (ref 6.5–8.1)

## 2020-07-19 LAB — CBC
HCT: 38.7 % — ABNORMAL LOW (ref 39.0–52.0)
Hemoglobin: 12.9 g/dL — ABNORMAL LOW (ref 13.0–17.0)
MCH: 30.6 pg (ref 26.0–34.0)
MCHC: 33.3 g/dL (ref 30.0–36.0)
MCV: 91.7 fL (ref 80.0–100.0)
Platelets: 308 10*3/uL (ref 150–400)
RBC: 4.22 MIL/uL (ref 4.22–5.81)
RDW: 14.6 % (ref 11.5–15.5)
WBC: 8.2 10*3/uL (ref 4.0–10.5)
nRBC: 0 % (ref 0.0–0.2)

## 2020-07-19 LAB — OSMOLALITY, URINE: Osmolality, Ur: 378 mOsm/kg (ref 300–900)

## 2020-07-19 LAB — VITAMIN B12: Vitamin B-12: 399 pg/mL (ref 180–914)

## 2020-07-19 LAB — VALPROIC ACID LEVEL: Valproic Acid Lvl: 47 ug/mL — ABNORMAL LOW (ref 50.0–100.0)

## 2020-07-19 LAB — TSH: TSH: 2.414 u[IU]/mL (ref 0.350–4.500)

## 2020-07-19 LAB — FOLATE: Folate: 19.5 ng/mL (ref >5.9)

## 2020-07-19 SURGERY — ADRENALECTOMY, ROBOT-ASSISTED
Anesthesia: General | Laterality: Right

## 2020-07-19 MED ORDER — LABETALOL HCL 5 MG/ML IV SOLN: 10.0000 mg | Freq: Once | INTRAVENOUS | Status: DC

## 2020-07-19 MED ORDER — LISINOPRIL 10 MG PO TABS
10.0000 mg | ORAL_TABLET | Freq: Every day | ORAL | Status: DC
  Administered 2020-07-19 – 2020-07-20 (×2): 10 mg via ORAL
  Filled 2020-07-19 (×2): qty 1

## 2020-07-19 MED ORDER — HYDRALAZINE HCL 20 MG/ML IJ SOLN: 10.0000 mg | Freq: Four times a day (QID) | INTRAMUSCULAR | Status: DC | PRN

## 2020-07-19 MED ORDER — NICOTINE 21 MG/24HR TD PT24
21.0000 mg | MEDICATED_PATCH | Freq: Every day | TRANSDERMAL | Status: DC
  Administered 2020-07-19: 21 mg via TRANSDERMAL
  Filled 2020-07-19 (×2): qty 1

## 2020-07-19 NOTE — Progress Notes (Addendum)
PROGRESS NOTE  Matthew Nguyen BWG:[id number] DOB: 1970-10-14 DOA: 07/18/2020 PCP: Abran Richard, MD   LOS: 0 days   Brief narrative:  Patient is a 50 year old male with past medical history of GERD, seizures, migraines, hyponatremia, right adrenal mass with negative biochemical work-up was brought to the ED from short stay after he had a witnessed seizure  with fall from standing height that lasted about 30 sec-1 min.  Initially, he had presented to short stay for planned robotic right adrenalectomy  but due to his seizure at the preop desk he was transferred to the ED.  In the ED, and had a second witnessed seizure and was given Ativan and loading dose of Depakote.  Neurology was consulted.  Patient was noted to have hyponatremia so OxyContin regimen was discontinued and patient was started on Keppra.  Initial sodium level was sodium 123,valproic acid level undetectable.  Patient was then placed in observation in the hospital.  Assessment/Plan:  Active Problems:   Localization-related symptomatic epilepsy and epileptic syndromes with complex partial seizures, intractable, without status epilepticus (HCC)   Adrenal nodule (HCC)   Seizure ([place])   Hyponatremia   Alcohol abuse  Breakthrough seizures with history of epilepsy Likely secondary to medication noncompliance compounded with hyponatremia.  Ox carbamazepine has been discontinued.  Neurology has seen the patient.  Patient has been started on Keppra.  Continue Depakote and zonisamide.  Continue seizure precautions.  No further episodes since yesterday.  Will watch for at least 24 hours.  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 Follow EEG.   Acute on chronic hyponatremia Likely secondary to antiseizure medications, beer ingestion.  Patient was counseled against drinking beer.  Oxcarbazepine has been discontinued.   Alcohol use disorder. Patient denies any dependence or withdrawal from it.  He was counseled about it.  Continue  CIWA protocol for now  Migraines. On  Sumitriptan as outpatient.   Uncontrolled blood pressure.  Likely undiagnosed essential hypertension.  No signs of withdrawal at this time.  We will start the patient on lisinopril.  As needed hydralazine. Might need outpatient monitoring.   Left elbow pain s/p fall Continue Tylenol.  Right adrenal mass Will need outpatient follow-up with surgery and with neurology for preop clearance   DVT prophylaxis: enoxaparin (LOVENOX) injection 40 mg Start: 07/18/20 2200  Code Status: Full code  Family Communication: Spoke with patient's sister at bedside  Status is: Observation  The patient will require care spanning > 2 midnights and should be moved to inpatient because: Ongoing diagnostic testing needed not appropriate for outpatient work up, IV treatments appropriate due to intensity of illness or inability to take PO, Inpatient level of care appropriate due to severity of illness and Monitoring for seizures.  Dispo: The patient is from: Home              Anticipated d/c is to: Home              Patient currently is not medically stable to d/c.   Difficult to place patient No       Consultants:  Neurology  Procedures:  EEG  Anti-infectives:  . None  Anti-infectives (From admission, onward)   None     Subjective: Today, patient was seen and examined at bedside.  Patient denies any headaches or seizures.  Patient sister at bedside.  Denies any nausea vomiting or abdominal pain.  Objective: Vitals:   07/19/20 1143 07/19/20 1305  BP:  (!) 150/109  Pulse:  96  Resp:  18  Temp:  98 F (36.7 C)  SpO2: 97% 98%    Intake/Output Summary (Last 24 hours) at 07/19/2020 1316 Last data filed at 07/19/2020 0930 Gross per 24 hour  Intake 2203.66 ml  Output --  Net 2203.66 ml   Filed Weights   07/18/20 1143  Weight: 72 kg   Body mass index is 24.14 kg/m.   Physical Exam: GENERAL: Patient is alert awake and oriented. Not in  obvious distress. HENT: No scleral pallor or icterus. Pupils equally reactive to light. Oral mucosa is moist NECK: is supple, no gross swelling noted. CHEST: Clear to auscultation. No crackles or wheezes.  Diminished breath sounds bilaterally. CVS: S1 and S2 heard, no murmur. Regular rate and rhythm.  ABDOMEN: Soft, non-tender, bowel sounds are present. EXTREMITIES: No edema. CNS: Cranial nerves are intact. No focal motor deficits.  No tremors noted. SKIN: warm and dry without rashes.  Data Review: I have personally reviewed the following laboratory data and studies,  CBC: Recent Labs  Lab 07/15/20 1320 07/18/20 1246 07/19/20 0529  WBC 10.9* 9.6 8.2  NEUTROABS 8.0* 8.0*  --   HGB 13.8 13.1 12.9*  HCT 40.7 38.6* 38.7*  MCV 89.6 89.6 91.7  PLT 354 324 643   Basic Metabolic Panel: Recent Labs  Lab 07/15/20 1320 07/18/20 1246 07/19/20 0529  NA 129* 123* 128*  K 4.7 4.6 3.7  CL 93* 91* 99  CO2 26 21* 20*  GLUCOSE 123* 80 79  BUN 19 11 8   CREATININE 1.44* 0.96 0.95  CALCIUM 9.3 9.0 8.6*  MG  --  1.8  --    Liver Function Tests: Recent Labs  Lab 07/18/20 1246 07/19/20 0529  AST 34 37  ALT 27 28  ALKPHOS 88 83  BILITOT 0.8 1.0  PROT 7.6 6.7  ALBUMIN 4.4 3.7   No results for input(s): LIPASE, AMYLASE in the last 168 hours. No results for input(s): AMMONIA in the last 168 hours. Cardiac Enzymes: No results for input(s): CKTOTAL, CKMB, CKMBINDEX, TROPONINI in the last 168 hours. BNP (last 3 results) No results for input(s): BNP in the last 8760 hours.  ProBNP (last 3 results) No results for input(s): PROBNP in the last 8760 hours.  CBG: Recent Labs  Lab 07/18/20 1252  GLUCAP 83   Recent Results (from the past 240 hour(s))  SARS CORONAVIRUS 2 (TAT 6-24 HRS) Nasopharyngeal Nasopharyngeal Swab     Status: None   Collection Time: 07/15/20  1:53 PM   Specimen: Nasopharyngeal Swab  Result Value Ref Range Status   SARS Coronavirus 2 NEGATIVE NEGATIVE Final     Comment: (NOTE) SARS-CoV-2 target nucleic acids are NOT DETECTED.  The SARS-CoV-2 RNA is generally detectable in upper and lower respiratory specimens during the acute phase of infection. Negative results do not preclude SARS-CoV-2 infection, do not rule out co-infections with other pathogens, and should not be used as the sole basis for treatment or other patient management decisions. Negative results must be combined with clinical observations, patient history, and epidemiological information. The expected result is Negative.  Fact Sheet for Patients: SugarRoll.be  Fact Sheet for Healthcare Providers: https://www.woods-mathews.com/  This test is not yet approved or cleared by the [place] FDA and  has been authorized for detection and/or diagnosis of SARS-CoV-2 by FDA under an Emergency Use Authorization (EUA). This EUA will remain  in effect (meaning this test can be used) for the duration of the COVID-19 declaration under Se ction 564(b)(1) of the Act,  21 U.S.C. section 360bbb-3(b)(1), unless the authorization is terminated or revoked sooner.  Performed at [facility], [place] [address], [place], [place] [address]   SARS CORONAVIRUS 2 (TAT 6-24 HRS) Nasopharyngeal Nasopharyngeal Swab     Status: None   Collection Time: 07/18/20  3:23 PM   Specimen: Nasopharyngeal Swab  Result Value Ref Range Status   SARS Coronavirus 2 NEGATIVE NEGATIVE Final    Comment: (NOTE) SARS-CoV-2 target nucleic acids are NOT DETECTED.  The SARS-CoV-2 RNA is generally detectable in upper and lower respiratory specimens during the acute phase of infection. Negative results do not preclude SARS-CoV-2 infection, do not rule out co-infections with other pathogens, and should not be used as the sole basis for treatment or other patient management decisions. Negative results must be combined with clinical observations, patient history, and  epidemiological information. The expected result is Negative.  Fact Sheet for Patients: SugarRoll.be  Fact Sheet for Healthcare Providers: https://www.woods-mathews.com/  This test is not yet approved or cleared by the [place] FDA and  has been authorized for detection and/or diagnosis of SARS-CoV-2 by FDA under an Emergency Use Authorization (EUA). This EUA will remain  in effect (meaning this test can be used) for the duration of the COVID-19 declaration under Se ction 564(b)(1) of the Act, 21 U.S.C. section 360bbb-3(b)(1), unless the authorization is terminated or revoked sooner.  Performed at [facility], [place] [address], [place], [place] [address]      Studies: DG Elbow Complete Left  Result Date: 07/18/2020 CLINICAL DATA:  Left olecranon pain after fall today. EXAM: LEFT ELBOW - COMPLETE 3+ VIEW COMPARISON:  None. FINDINGS: There is no evidence of fracture, dislocation, or joint effusion. There is no evidence of arthropathy or other focal bone abnormality. Soft tissues are unremarkable. Peripheral venous catheter overlies the anterior elbow. IMPRESSION: Negative left elbow radiographs. Electronically Signed   By: Dahlia Bailiff MD   On: 07/18/2020 13:43   CT Head Wo Contrast  Result Date: 07/18/2020 CLINICAL DATA:  Head trauma and seizure EXAM: CT HEAD WITHOUT CONTRAST TECHNIQUE: Contiguous axial images were obtained from the base of the skull through the vertex without intravenous contrast. COMPARISON:  Head CT October 24, 2018 and MRI brain November 19, 2018 FINDINGS: Brain: No evidence of acute large vascular territory infarction, hemorrhage, hydrocephalus, extra-axial collection or mass lesion/mass effect. Vascular: No hyperdense vessel or unexpected calcification. Skull: Normal. Negative for fracture or focal lesion. Sinuses/Orbits: The paranasal sinuses the mastoid air cells are predominantly clear. Other: Mild soft tissue swelling  overlying the left posterior calvarium. IMPRESSION: No acute intracranial findings. Mild soft tissue swelling overlying the left posterior calvarium. Electronically Signed   By: Dahlia Bailiff MD   On: 07/18/2020 13:48   MR BRAIN WO CONTRAST  Result Date: 07/18/2020 CLINICAL DATA:  Seizure EXAM: MRI HEAD WITHOUT CONTRAST TECHNIQUE: Multiplanar, multiecho pulse sequences of the brain and surrounding structures were obtained without intravenous contrast. COMPARISON:  None. FINDINGS: Brain: No acute infarct, mass effect or extra-axial collection. No acute hemorrhage. Minimal multifocal hyperintense T2-weight signal within the white matter. Old left cerebellar infarct. Vascular: Major flow voids are preserved. Skull and upper cervical spine: Normal calvarium and skull base. Visualized upper cervical spine and soft tissues are normal. Sinuses/Orbits:Bilateral mastoid fluid.  Normal orbits. IMPRESSION: 1. No acute intracranial abnormality. 2. Old left cerebellar infarct. Electronically Signed   By: Ulyses Jarred M.D.   On: 07/18/2020 19:49      Flora Lipps, MD  [facility] 07/19/2020  If 7PM-7AM, please contact night-coverage

## 2020-07-19 NOTE — Progress Notes (Signed)
EEG complete - results pending 

## 2020-07-19 NOTE — Discharge Instructions (Signed)
Residential Substance Use Treatment Services   [facility] ([place]  [place], [place] [address]  [phone] or [phone] Detox (Medicare, Medicaid, private insurance, and self pay)  Residential Rehab 14 days (Medicare, [place], private insurance, and self pay)   [facility] (Residential Treatment Services)  [address] [place], [place]  Male and Male Detox (Self Pay and Medicaid limited availability)  Rehab only Male ([place] and self pay only)   [facility] [address]      [address]  [place], [place] [address]  [phone] or [phone] Detox and [place]  [place].  [place], [place] [address]  [phone]  Treatment Only, must make assessment appointment, and must be sober for assessment appointment.  Self Pay Only, Medicare A&B, [facility], [place] ID only! *Transportation assistance offered from [place] on [address], [place] [address] Walk in interviews M-Sat 8-4p No pending legal charges [phone]     ADATC:  [facility] Referral  [address] [place], [place] (Self Pay, [facility])  [facility] [address] [place], [place] [address] [phone] Detox and Residential Treatment Medicare and [place].  [place], [place] [address] [place]: [place]: [phone] Long-term Residential Program:  [phone] Males 25 and Over (No Insurance, upfront fee)  [place], [place] [address] [phone] Private Insurance with [place], [place], [place] [address] Local ([place].  [place], [place] [address]  [phone] (Males, upfront fee)  [place]  [place], [place] Locations  [facility]  [address]  [place], [place] Based Program for individuals experiencing homelessness Self Pay, No insurance  [place]  Men's program: [facility] [place] [address] [place], [place] [address] [phone]  [place] Women's program: [facility] [address] [place], [place] [address] [phone] Christian Based Program for individuals experiencing homelessness Self Pay, No insurance  [facility] Men's Division [address] [place], [place] [address]  [place] for individuals experiencing homelessness Self Pay, No insurance  [facility] Women's Division [place], [place] [address] [place] for individuals experiencing homelessness Self Pay, No insurance  [facility] [place], [place] for males experiencing homelessness Self Pay, No insurance

## 2020-07-19 NOTE — Progress Notes (Signed)
Chart review no EEG completed and is normal. Recommendations as in the consultation note from last night. Plan was discussed with the hospitalist earlier this morning. Please call with questions-inpatient neurology service will be available as needed.   -- Amie [place], MD Neurologist [facility] Pager: [phone]

## 2020-07-19 NOTE — Care Management Obs Status (Signed)
[place] NOTIFICATION   Patient Details  Name: Muneer Leider MRN: [id number] Date of Birth: [birth year]   Medicare Observation Status Notification Given:  Yes    MahabirJuliann Pulse, RN 07/19/2020, 10:28 AM

## 2020-07-19 NOTE — Procedures (Signed)
EEG Report Indication: witnessed seizure activity  This study was recorded in the waking and drowsy state.  The duration of the study was 26 minutes.  Electrodes were placed according to the International 10/20 system.  Video was reviewed/available for clinical correlation as needed.  In the waking state, clear background organization is seen with a distinct anterior - posterior voltage and frequency gradient and a symmetric and reactive posterior dominant rhythm of approximately 10 hertz.  Anteriorly, is the expected pattern of faster frequency, lower voltage waveforms.  During the drowsy state, there is attenuation of the gradient, a general shift to slower frequencies diffusely, a waxing and waning of the posterior dominant rhythm, and slow roving eye movements.  Hyperventilation: deferred Photic stimulation: deferred  There are no clear focal, paroxysmal or epileptiform abnormalities  or interhemispheric asymmetries.  Impression:  This is a normal waking and drowsy study.  There are no clear focal or epileptiform abnormalities.

## 2020-07-20 LAB — CBC
HCT: 36.1 % — ABNORMAL LOW (ref 39.0–52.0)
Hemoglobin: 12 g/dL — ABNORMAL LOW (ref 13.0–17.0)
MCH: 30.5 pg (ref 26.0–34.0)
MCHC: 33.2 g/dL (ref 30.0–36.0)
MCV: 91.9 fL (ref 80.0–100.0)
Platelets: 260 10*3/uL (ref 150–400)
RBC: 3.93 MIL/uL — ABNORMAL LOW (ref 4.22–5.81)
RDW: 14.9 % (ref 11.5–15.5)
WBC: 8.8 10*3/uL (ref 4.0–10.5)
nRBC: 0 % (ref 0.0–0.2)

## 2020-07-20 LAB — BASIC METABOLIC PANEL
Anion gap: 9 (ref 5–15)
BUN: 8 mg/dL (ref 6–20)
CO2: 19 mmol/L — ABNORMAL LOW (ref 22–32)
Calcium: 8.5 mg/dL — ABNORMAL LOW (ref 8.9–10.3)
Chloride: 103 mmol/L (ref 98–111)
Creatinine, Ser: 0.93 mg/dL (ref 0.61–1.24)
GFR, Estimated: >60 mL/min (ref >60)
Glucose, Bld: 85 mg/dL (ref 70–99)
Potassium: 3.4 mmol/L — ABNORMAL LOW (ref 3.5–5.1)
Sodium: 131 mmol/L — ABNORMAL LOW (ref 135–145)

## 2020-07-20 LAB — MAGNESIUM: Magnesium: 1.8 mg/dL (ref 1.7–2.4)

## 2020-07-20 LAB — PHOSPHORUS: Phosphorus: 3.1 mg/dL (ref 2.5–4.6)

## 2020-07-20 MED ORDER — THIAMINE HCL 100 MG PO TABS: 100.0000 mg | ORAL_TABLET | Freq: Every day | ORAL | 0 refills | Status: AC

## 2020-07-20 MED ORDER — AMLODIPINE BESYLATE 10 MG PO TABS: 10.0000 mg | ORAL_TABLET | Freq: Every day | ORAL | 2 refills | Status: AC

## 2020-07-20 MED ORDER — LEVETIRACETAM 500 MG PO TABS: 500.0000 mg | ORAL_TABLET | Freq: Two times a day (BID) | ORAL | 2 refills | Status: DC

## 2020-07-20 MED ORDER — POTASSIUM CHLORIDE CRYS ER 20 MEQ PO TBCR
40.0000 meq | EXTENDED_RELEASE_TABLET | Freq: Once | ORAL | Status: AC
  Administered 2020-07-20: 40 meq via ORAL
  Filled 2020-07-20: qty 2

## 2020-07-20 MED ORDER — LISINOPRIL 10 MG PO TABS: 10.0000 mg | ORAL_TABLET | Freq: Every day | ORAL | 2 refills | Status: AC

## 2020-07-20 MED ORDER — AMLODIPINE BESYLATE 10 MG PO TABS
10.0000 mg | ORAL_TABLET | Freq: Every day | ORAL | Status: DC
  Administered 2020-07-20: 10 mg via ORAL
  Filled 2020-07-20: qty 1

## 2020-07-20 MED ORDER — FOLIC ACID 1 MG PO TABS: 1.0000 mg | ORAL_TABLET | Freq: Every day | ORAL | 0 refills | Status: AC

## 2020-07-20 MED ORDER — ZONISAMIDE 100 MG PO CAPS: 500.0000 mg | ORAL_CAPSULE | Freq: Every day | ORAL | Status: AC

## 2020-07-20 NOTE — Discharge Summary (Signed)
Physician Discharge Summary  Matthew Nguyen GXQ:[id number] DOB: [birth year] DOA: 07/18/2020  PCP: Abran Richard, MD  Admit date: 07/18/2020 Discharge date: 07/20/2020  Admitted From: Home  Discharge disposition: Home   Recommendations for Outpatient Follow-Up:   . Follow up with your primary care provider in one week.  . Check CBC, BMP, magnesium in the next visit . Follow-up with neurology as has been scheduled by you.  Do not drive until okay with your primary care physician or neurology. . Patient will need optimization of his blood pressure medication as outpatient.  Discharge Diagnosis:   Active Problems:   Localization-related symptomatic epilepsy and epileptic syndromes with complex partial seizures, intractable, without status epilepticus ([place])   Adrenal nodule ([place])   Seizure ([place])   Hyponatremia   Alcohol abuse   Discharge Condition: Improved.  Diet recommendation:   Regular.  Wound care: None.  Code status: Full.   History of Present Illness:   Patient is a 50 year old male with past medical history of GERD, seizures, migraines, hyponatremia, right adrenal mass with negative biochemical work-up was brought to the ED from short stay after he had a witnessed seizure  with fall from standing height that lasted about 30sec-1 min. Initially, he had presented to short stay for planned robotic right adrenalectomy  but due to his seizure at the preop desk he was transferred to the ED. In the ED, and had a second witnessed seizure and was given Ativan and loading dose of Depakote.  Neurology was consulted.  Patient was noted to have hyponatremia so OxyContin regimen was discontinued and patient was started on Keppra.  Initial sodium level was sodium 123,valproic acid level undetectable.  Patient was then placed in observation in the hospital.  Hospital Course:   Following conditions were addressed during hospitalization as listed below,  Breakthrough seizureswith  history of epilepsy Likely secondary to medication noncompliance compounded with hyponatremia.  Oxcarbamazepine has been discontinued.  Neurology has seen the patient.  Patient has been started on Keppra instead.  Patient was advised about that but.  Continue Depakote and zonisamide.   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  EEG did not show any evidence of seizure like episode.  Neurology followed the patient.  Okay for discharge at this time.  Acute on chronic hyponatremia Likely secondary to antiseizure medications, beer ingestion.  Patient was counseled against drinking beer.  Oxcarbazepine has been discontinued.  Sodium has improved to 131.  He does have baseline mild hyponatremia.  Alcohol use disorder. Patient denies any dependence or withdrawal from it.  He was counseled about it.  He did not have any withdrawal symptoms during hospitalization.  Migraines. On  Sumitriptan as outpatient.   Uncontrolled blood pressure.  Likely undiagnosed essential hypertension.  No signs of withdrawal at this time.    Patient has been started on lisinopril and low dose amlodipine..   Will need titration of his blood pressure medications as outpatient.  Left elbow pain s/p fall X-ray negative for fracture.  Mild hypokalemia.  Was given p.o. replacement prior to discharge.  Right adrenal mass Will need outpatient follow-up with surgery and with neurology for preop clearance  Disposition.  At this time, patient is stable for disposition home with outpatient neurology and PCP follow-up.  Medical Consultants:    Neurology  Procedures:    EEG Subjective:   Today, patient was seen and examined at bedside.  No further seizures.  No headache.  Wishes to go home.  Vitals with  BMI 07/20/2020 07/19/2020 07/19/2020  Height - - -  Weight - - -  BMI - - -  Systolic [phone]  Diastolic 324 401 027  Pulse 91 94 96   General: Alert awake, not in obvious distress, oriented, HENT:  pupils equally reacting to light,  No scleral pallor or icterus noted. Oral mucosa is moist.  Chest:  Clear breath sounds.  Diminished breath sounds bilaterally. No crackles or wheezes.  CVS: S1 &S2 heard. No murmur.  Regular rate and rhythm. Abdomen: Soft, nontender, nondistended.  Bowel sounds are heard.   Extremities: No cyanosis, clubbing or edema.  Peripheral pulses are palpable. Psych: Alert, awake and oriented, normal mood CNS:  No cranial nerve deficits.  Power equal in all extremities.   Skin: Warm and dry.  No rashes noted.  The results of significant diagnostics from this hospitalization (including imaging, microbiology, ancillary and laboratory) are listed below for reference.     Diagnostic Studies:   EEG adult  Result Date: 07/19/2020 Raenette Rover, MD     07/19/2020  6:15 PM EEG Report Indication: witnessed seizure activity This study was recorded in the waking and drowsy state.  The duration of the study was 26 minutes.  Electrodes were placed according to the International 10/20 system.  Video was reviewed/available for clinical correlation as needed. In the waking state, clear background organization is seen with a distinct anterior - posterior voltage and frequency gradient and a symmetric and reactive posterior dominant rhythm of approximately 10 hertz.  Anteriorly, is the expected pattern of faster frequency, lower voltage waveforms. During the drowsy state, there is attenuation of the gradient, a general shift to slower frequencies diffusely, a waxing and waning of the posterior dominant rhythm, and slow roving eye movements. Hyperventilation: deferred Photic stimulation: deferred There are no clear focal, paroxysmal or epileptiform abnormalities  or interhemispheric asymmetries. Impression: This is a normal waking and drowsy study.  There are no clear focal or epileptiform abnormalities.     Labs:   Basic Metabolic Panel: Recent Labs  Lab 07/15/20 1320 07/18/20 1246  07/19/20 0529 07/20/20 0448  NA 129* 123* 128* 131*  K 4.7 4.6 3.7 3.4*  CL 93* 91* 99 103  CO2 26 21* 20* 19*  GLUCOSE 123* 80 79 85  BUN 19 11 8 8   CREATININE 1.44* 0.96 0.95 0.93  CALCIUM 9.3 9.0 8.6* 8.5*  MG  --  1.8  --  1.8  PHOS  --   --   --  3.1   GFR Estimated Creatinine Clearance: 91.9 mL/min (by C-G formula based on SCr of 0.93 mg/dL). Liver Function Tests: Recent Labs  Lab 07/18/20 1246 07/19/20 0529  AST 34 37  ALT 27 28  ALKPHOS 88 83  BILITOT 0.8 1.0  PROT 7.6 6.7  ALBUMIN 4.4 3.7   No results for input(s): LIPASE, AMYLASE in the last 168 hours. No results for input(s): AMMONIA in the last 168 hours. Coagulation profile No results for input(s): INR, PROTIME in the last 168 hours.  CBC: Recent Labs  Lab 07/15/20 1320 07/18/20 1246 07/19/20 0529 07/20/20 0448  WBC 10.9* 9.6 8.2 8.8  NEUTROABS 8.0* 8.0*  --   --   HGB 13.8 13.1 12.9* 12.0*  HCT 40.7 38.6* 38.7* 36.1*  MCV 89.6 89.6 91.7 91.9  PLT 354 324 308 260   Cardiac Enzymes: No results for input(s): CKTOTAL, CKMB, CKMBINDEX, TROPONINI in the last 168 hours. BNP: Invalid input(s): POCBNP CBG: Recent Labs  Lab 07/18/20 1252  GLUCAP 83   D-Dimer No results for input(s): DDIMER in the last 72 hours. Hgb A1c No results for input(s): HGBA1C in the last 72 hours. Lipid Profile No results for input(s): CHOL, HDL, LDLCALC, TRIG, CHOLHDL, LDLDIRECT in the last 72 hours. Thyroid function studies Recent Labs    07/19/20 0529  TSH 2.414   Anemia work up Recent Labs    07/19/20 0529  VITAMINB12 399  FOLATE 19.5   Microbiology Recent Results (from the past 240 hour(s))  SARS CORONAVIRUS 2 (TAT 6-24 HRS) Nasopharyngeal Nasopharyngeal Swab     Status: None   Collection Time: 07/15/20  1:53 PM   Specimen: Nasopharyngeal Swab  Result Value Ref Range Status   SARS Coronavirus 2 NEGATIVE NEGATIVE Final    Comment: (NOTE) SARS-CoV-2 target nucleic acids are NOT DETECTED.  The  SARS-CoV-2 RNA is generally detectable in upper and lower respiratory specimens during the acute phase of infection. Negative results do not preclude SARS-CoV-2 infection, do not rule out co-infections with other pathogens, and should not be used as the sole basis for treatment or other patient management decisions. Negative results must be combined with clinical observations, patient history, and epidemiological information. The expected result is Negative.  Fact Sheet for Patients: SugarRoll.be  Fact Sheet for Healthcare Providers: https://www.woods-mathews.com/  This test is not yet approved or cleared by the [place] FDA and  has been authorized for detection and/or diagnosis of SARS-CoV-2 by FDA under an Emergency Use Authorization (EUA). This EUA will remain  in effect (meaning this test can be used) for the duration of the COVID-19 declaration under Se ction 564(b)(1) of the Act, 21 U.S.C. section 360bbb-3(b)(1), unless the authorization is terminated or revoked sooner.  Performed at [facility], [place] [address] [place], [place] [address]   SARS CORONAVIRUS 2 (TAT 6-24 HRS) Nasopharyngeal Nasopharyngeal Swab     Status: None   Collection Time: 07/18/20  3:23 PM   Specimen: Nasopharyngeal Swab  Result Value Ref Range Status   SARS Coronavirus 2 NEGATIVE NEGATIVE Final    Comment: (NOTE) SARS-CoV-2 target nucleic acids are NOT DETECTED.  The SARS-CoV-2 RNA is generally detectable in upper and lower respiratory specimens during the acute phase of infection. Negative results do not preclude SARS-CoV-2 infection, do not rule out co-infections with other pathogens, and should not be used as the sole basis for treatment or other patient management decisions. Negative results must be combined with clinical observations, patient history, and epidemiological information. The expected result is Negative.  Fact Sheet for  Patients: SugarRoll.be  Fact Sheet for Healthcare Providers: https://www.woods-mathews.com/  This test is not yet approved or cleared by the [place] FDA and  has been authorized for detection and/or diagnosis of SARS-CoV-2 by FDA under an Emergency Use Authorization (EUA). This EUA will remain  in effect (meaning this test can be used) for the duration of the COVID-19 declaration under Se ction 564(b)(1) of the Act, 21 U.S.C. section 360bbb-3(b)(1), unless the authorization is terminated or revoked sooner.  Performed at [facility], [place] [address] [place], [place] [address]      Discharge Instructions:   Discharge Instructions    Call MD for:   Complete by: As directed    Further seizures.   Diet - low sodium heart healthy   Complete by: As directed    Discharge instructions   Complete by: As directed    Follow-up with your primary care physician in 1  week.  Check blood work at that time.  Do not take Trileptal.  You have been prescribed Keppra.  Do not drink alcohol since it can make your sodium go down and have more seizures.  Follow-up with your neurologist as outpatient as has been scheduled.   Increase activity slowly   Complete by: As directed      Allergies as of 07/20/2020      Reactions   Morphine Nausea And Vomiting      Medication List    STOP taking these medications   Oxcarbazepine 300 MG tablet Commonly known as: TRILEPTAL     TAKE these medications   acetaminophen 325 MG tablet Commonly known as: TYLENOL Take 650 mg by mouth every 6 (six) hours as needed for mild pain, fever or headache.   amitriptyline 50 MG tablet Commonly known as: ELAVIL TAKE 3 TABLETS (150 MG TOTAL) BY MOUTH AT BEDTIME.   amLODipine 10 MG tablet Commonly known as: NORVASC Take 1 tablet (10 mg total) by mouth daily.   divalproex 500 MG DR tablet Commonly known as: DEPAKOTE TAKE 2 TABLETS IN THE MORNING  AND TAKE 2  TABLETS AT NIGHT   Emgality 120 MG/ML [facility] Generic drug: Galcanezumab-gnlm INJECT 120MG  (1 PEN) INTO THE SKIN EVERY 30 DAYS What changed: See the new instructions.   folic acid 1 MG tablet Commonly known as: FOLVITE Take 1 tablet (1 mg total) by mouth daily.   levETIRAcetam 500 MG tablet Commonly known as: KEPPRA Take 1 tablet (500 mg total) by mouth every 12 (twelve) hours.   lisinopril 10 MG tablet Commonly known as: ZESTRIL Take 1 tablet (10 mg total) by mouth daily.   ondansetron 4 MG tablet Commonly known as: ZOFRAN Take 1 tablet (4 mg total) by mouth every 8 (eight) hours as needed for nausea or vomiting.   polyethylene glycol 17 g packet Commonly known as: MIRALAX / GLYCOLAX Take 17 g by mouth daily.   SUMAtriptan 50 MG tablet Commonly known as: IMITREX TAKE 1 TABLET EVERY TWO HOURS AS NEEDED FOR MIGRAINE OR HEADACHE. MAY REPEAT IN 2 HOURS IF HEADACHE PERSISTS OR RECURS What changed: See the new instructions.   thiamine 100 MG tablet Take 1 tablet (100 mg total) by mouth daily.   zonisamide 100 MG capsule Commonly known as: ZONEGRAN Take 5 capsules (500 mg total) by mouth at bedtime. What changed: See the new instructions.       Follow-up Information    Abran Richard, MD Follow up in 1 week(s).   Specialty: Internal Medicine Why: regular checkup Contact information: [address] [place] [address] [phone]        Your neurologist Follow up.   Why: as has been scheduled by you              Time coordinating discharge: 39 minutes  Signed:  Hutson Luft  [facility] 07/20/2020, 9:09 AM

## 2020-08-11 DIAGNOSIS — I10 Essential (primary) hypertension: Secondary | ICD-10-CM

## 2020-08-11 HISTORY — DX: Essential (primary) hypertension: I10

## 2020-09-11 ENCOUNTER — Telehealth (INDEPENDENT_AMBULATORY_CARE_PROVIDER_SITE_OTHER): Payer: Medicare HMO | Admitting: Neurology

## 2020-09-11 ENCOUNTER — Encounter: Payer: Self-pay | Admitting: Neurology

## 2020-09-11 ENCOUNTER — Other Ambulatory Visit: Payer: Self-pay

## 2020-09-11 VITALS — Ht 68.0 in | Wt 160.0 lb

## 2020-09-11 DIAGNOSIS — G40009 Localization-related (focal) (partial) idiopathic epilepsy and epileptic syndromes with seizures of localized onset, not intractable, without status epilepticus: Secondary | ICD-10-CM | POA: Diagnosis not present

## 2020-09-11 DIAGNOSIS — R519 Headache, unspecified: Secondary | ICD-10-CM

## 2020-09-11 MED ORDER — LEVETIRACETAM 1000 MG PO TABS: 1000.0000 mg | ORAL_TABLET | Freq: Two times a day (BID) | ORAL | 3 refills | Status: AC
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
## 2020-09-23 ENCOUNTER — Other Ambulatory Visit (HOSPITAL_COMMUNITY): Payer: Self-pay

## 2020-09-24 ENCOUNTER — Other Ambulatory Visit: Payer: Self-pay

## 2020-09-24 ENCOUNTER — Ambulatory Visit: Payer: Self-pay | Admitting: Surgery

## 2020-09-25 NOTE — Patient Instructions (Signed)
DUE TO COVID-19 ONLY ONE VISITOR IS ALLOWED TO COME WITH YOU AND STAY IN THE WAITING ROOM ONLY DURING PRE OP AND PROCEDURE.    **NO VISITORS ARE ALLOWED IN THE SHORT STAY AREA OR RECOVERY ROOM!!**  IF YOU WILL BE ADMITTED INTO THE HOSPITAL YOU ARE ALLOWED ONLY TWO SUPPORT PEOPLE DURING VISITATION HOURS ONLY (10AM -8PM)   The support person(s) may change daily. The support person(s) must pass our screening, gel in and out, and wear a mask at all times, including in the patient's room. Patients must also wear a mask when staff or their support person are in the room.  No visitors under the age of 70. Any visitor under the age of 49 must be accompanied by an adult.    COVID SWAB TESTING MUST BE COMPLETED ON:  6/16 at 1:05 pm    [address] [place], [place] [address]   You are not required to quarantine, however you are required to wear a well-fitted mask when you are out and around people not in your household.  Hand Hygiene often Do NOT share personal items Notify your provider if you are in close contact with someone who has COVID or you develop fever 100.4 or greater, new onset of sneezing, cough, sore throat, shortness of breath or body aches.    Your procedure is scheduled on: 09/30/20   Report to [facility] Main  Entrance   Report to Short Stay at 5:00 AM   [facility])    Call this number if you have problems the morning of surgery [phone]   Do not eat food :After Midnight.   May have liquids until  4:30 am  day of surgery  CLEAR LIQUID DIET  Foods Allowed                                                                     Foods Excluded  Water, Black Coffee and tea, regular and decaf                             liquids that you cannot  Plain Jell-O in any flavor  (No red)                                           see through such as: Fruit ices (not with fruit pulp)                                     milk, soups, orange juice              Iced  Popsicles (No red)                                    All solid food  Apple juices Sports drinks like Gatorade (No red) Lightly seasoned clear broth or consume(fat free) Sugar, honey syrup     .     The day of surgery:  Drink ONE (1) Pre-Surgery Clear Ensure by   5:00 am the morning of surgery. Drink in one sitting. Do not sip.  This drink was given to you during your hospital  pre-op appointment visit. Nothing else to drink after completing the  Pre-Surgery Clear Ensure or G2.          If you have questions, please contact your surgeon's office.     Oral Hygiene is also important to reduce your risk of infection.                                    Remember - BRUSH YOUR TEETH THE MORNING OF SURGERY WITH YOUR REGULAR TOOTHPASTE   Do NOT smoke after Midnight   Take these medicines the morning of surgery with A SIP OF WATER: Keppra, Amlodipine                               You may not have any metal on your body including hair pins, jewelry, and body piercing             Do not wear make-up, lotions, powders, perfumes/cologne, or deodorant               Men may shave face and neck.   Do not bring valuables to the hospital. [place].   Contacts, dentures or bridgework may not be worn into surgery.   Bring small overnight bag day of surgery.       Special Instructions: Bring a copy of your healthcare power of attorney and living will documents the day of surgery if you haven't scanned them in before.              Please read over the following fact sheets you were given:  IF YOU HAVE QUESTIONS ABOUT YOUR PRE OP INSTRUCTIONS PLEASE CALL [phone]    [facility] - Preparing for Surgery Before surgery, you can play an important role.  Because skin is not sterile, your skin needs to be as free of germs as possible.  You can reduce the number of germs on your skin by washing with CHG  (chlorahexidine gluconate) soap before surgery.  CHG is an antiseptic cleaner which kills germs and bonds with the skin to continue killing germs even after washing. Please DO NOT use if you have an allergy to CHG or antibacterial soaps.  If your skin becomes reddened/irritated stop using the CHG and inform your nurse when you arrive at Short Stay.  You may shave your face/neck.  Please follow these instructions carefully:  1.  Shower with CHG Soap the night before surgery and the  morning of Surgery.  2.  If you choose to wash your hair, wash your hair first as usual with your  normal  shampoo.  3.  After you shampoo, rinse your hair and body thoroughly to remove the  shampoo.  4.  Use CHG as you would any other liquid soap.  You can apply chg directly  to the skin and wash                       Gently with a scrungie or clean washcloth.  5.  Apply the CHG Soap to your body ONLY FROM THE NECK DOWN.   Do not use on face/ open                           Wound or open sores. Avoid contact with eyes, ears mouth and genitals (private parts).                       Wash face,  Genitals (private parts) with your normal soap.             6.  Wash thoroughly, paying special attention to the area where your surgery  will be performed.  7.  Thoroughly rinse your body with warm water from the neck down.  8.  DO NOT shower/wash with your normal soap after using and rinsing off  the CHG Soap.            9.  Pat yourself dry with a clean towel.            10.  Wear clean pajamas.            11.  Place clean sheets on your bed the night of your first shower and do not  sleep with pets. Day of Surgery : Do not apply any lotions/deodorants the morning of surgery.  Please wear clean clothes to the hospital/surgery center.  FAILURE TO FOLLOW THESE INSTRUCTIONS MAY RESULT IN THE CANCELLATION OF YOUR SURGERY PATIENT SIGNATURE_________________________________  NURSE  SIGNATURE__________________________________  ________________________________________________________________________

## 2020-09-26 ENCOUNTER — Encounter (HOSPITAL_COMMUNITY)
Admission: RE | Admit: 2020-09-26 | Discharge: 2020-09-26 | Disposition: A | Discharge status: Home / Self Care | Payer: Medicare HMO | Source: Ambulatory Visit | Attending: Surgery | Admitting: Surgery

## 2020-09-26 ENCOUNTER — Other Ambulatory Visit (HOSPITAL_COMMUNITY)
Admission: RE | Admit: 2020-09-26 | Discharge: 2020-09-26 | Disposition: A | Discharge status: Home / Self Care | Payer: Medicare HMO | Source: Ambulatory Visit | Attending: Surgery | Admitting: Surgery

## 2020-09-26 ENCOUNTER — Other Ambulatory Visit: Payer: Self-pay

## 2020-09-26 ENCOUNTER — Encounter (HOSPITAL_COMMUNITY): Payer: Self-pay

## 2020-09-26 DIAGNOSIS — D4411 Neoplasm of uncertain behavior of right adrenal gland: Secondary | ICD-10-CM | POA: Diagnosis not present

## 2020-09-26 DIAGNOSIS — Z20822 Contact with and (suspected) exposure to covid-19: Secondary | ICD-10-CM | POA: Insufficient documentation

## 2020-09-26 DIAGNOSIS — I10 Essential (primary) hypertension: Secondary | ICD-10-CM | POA: Diagnosis not present

## 2020-09-26 DIAGNOSIS — Z01812 Encounter for preprocedural laboratory examination: Secondary | ICD-10-CM | POA: Diagnosis present

## 2020-09-26 DIAGNOSIS — Z79899 Other long term (current) drug therapy: Secondary | ICD-10-CM | POA: Diagnosis not present

## 2020-09-26 DIAGNOSIS — Z87891 Personal history of nicotine dependence: Secondary | ICD-10-CM | POA: Diagnosis not present

## 2020-09-26 LAB — COMPREHENSIVE METABOLIC PANEL
ALT: 31 U/L (ref 0–44)
AST: 27 U/L (ref 15–41)
Albumin: 3.9 g/dL (ref 3.5–5.0)
Alkaline Phosphatase: 77 U/L (ref 38–126)
Anion gap: 9 (ref 5–15)
BUN: 10 mg/dL (ref 6–20)
CO2: 24 mmol/L (ref 22–32)
Calcium: 9 mg/dL (ref 8.9–10.3)
Chloride: 99 mmol/L (ref 98–111)
Creatinine, Ser: 1.17 mg/dL (ref 0.61–1.24)
GFR, Estimated: >60 mL/min (ref >60)
Glucose, Bld: 94 mg/dL (ref 70–99)
Potassium: 4.1 mmol/L (ref 3.5–5.1)
Sodium: 132 mmol/L — ABNORMAL LOW (ref 135–145)
Total Bilirubin: 0.2 mg/dL — ABNORMAL LOW (ref 0.3–1.2)
Total Protein: 7.1 g/dL (ref 6.5–8.1)

## 2020-09-26 LAB — CBC WITH DIFFERENTIAL/PLATELET
Abs Immature Granulocytes: 0.08 10*3/uL — ABNORMAL HIGH (ref 0.00–0.07)
Basophils Absolute: 0.1 10*3/uL (ref 0.0–0.1)
Basophils Relative: 1 %
Eosinophils Absolute: 0.5 10*3/uL (ref 0.0–0.5)
Eosinophils Relative: 6 %
HCT: 40.6 % (ref 39.0–52.0)
Hemoglobin: 13.6 g/dL (ref 13.0–17.0)
Immature Granulocytes: 1 %
Lymphocytes Relative: 21 %
Lymphs Abs: 1.7 10*3/uL (ref 0.7–4.0)
MCH: 32.2 pg (ref 26.0–34.0)
MCHC: 33.5 g/dL (ref 30.0–36.0)
MCV: 96.2 fL (ref 80.0–100.0)
Monocytes Absolute: 0.8 10*3/uL (ref 0.1–1.0)
Monocytes Relative: 10 %
Neutro Abs: 5 10*3/uL (ref 1.7–7.7)
Neutrophils Relative %: 61 %
Platelets: 213 10*3/uL (ref 150–400)
RBC: 4.22 MIL/uL (ref 4.22–5.81)
RDW: 14.8 % (ref 11.5–15.5)
WBC: 8.2 10*3/uL (ref 4.0–10.5)
nRBC: 0 % (ref 0.0–0.2)

## 2020-09-26 LAB — SARS CORONAVIRUS 2 (TAT 6-24 HRS): SARS Coronavirus 2: NEGATIVE

## 2020-09-26 NOTE — Progress Notes (Signed)
COVID Vaccine Completed:no Date COVID Vaccine completed: COVID vaccine manufacturer: [employer]   PCP - Dr. Anell Barr 09/11/20 Cardiologist - none  Chest x-ray - no EKG - 07/18/20-epic Stress Test - no ECHO - no Cardiac Cath - no Pacemaker/ICD device last checked:NA  Sleep Study - no CPAP -   Fasting Blood Sugar - NA Checks Blood Sugar _____ times a day  Blood Thinner Instructions:NA Aspirin Instructions: Last Dose:  Anesthesia review:   Patient denies shortness of breath, fever, cough and chest pain at PAT appointment Pt has been a smoker for 40 + years but reports no SOB with activities. He drinks and smokes pot daily. His last seizure was in May while in the hospital this year.  Patient verbalized understanding of instructions that were given to them at the PAT appointment. Patient was also instructed that they will need to review over the PAT instructions again at home before surgery. yes

## 2020-09-27 NOTE — Progress Notes (Signed)
Anesthesia Chart Review   Case: [id number] Date/Time: 09/30/20 0700   Procedure: XI ROBOTIC RIGHT ADRENALECTOMY (Right)   Anesthesia type: General   Pre-op diagnosis: ADRENOL MASS   Location: [facility] ROOM 02 / [facility]   Surgeons: Clovis Riley, MD       DISCUSSION:50 y.o. former smoker with h/o GERD, HTN, seizures, alcohol use disorder, adrenal mass scheduled for above procedure 09/30/2020 with Dr. Romana Juniper.   Pt previously scheduled for above procedure in April of this year.  While in short stay prior to procedure he experienced a seizure and was sent to the ED for evaluation.    Pt admitted 4/7-07/20/2020. Per discharge summary seizure likely secondary to medication noncompliance compounded with hyponatremia.  Oxcarbamazepine discontinued and Keppra started.   Followed up with neurology 09/11/20. Per OV note pt continues to drink 6-8 beers daily. Increase Levetiracetam to 1000mg  BID, continue Depakote 1000mg  BID and Zonisamide 500mg  qhs.    VS: BP (!) 130/91   Pulse 98   Temp 36.9 C (Oral)   Resp 18   Ht 5\' 8"  (1.727 m)   Wt 72.6 kg   SpO2 99%   BMI 24.33 kg/m   PROVIDERS: Abran Richard, MD is PCP   Ellouise Newer, MD is Neurologist  LABS: Labs reviewed: Acceptable for surgery. (all labs ordered are listed, but only abnormal results are displayed)  Labs Reviewed  CBC WITH DIFFERENTIAL/PLATELET - Abnormal; Notable for the following components:      Result Value   Abs Immature Granulocytes 0.08 (*)    All other components within normal limits  COMPREHENSIVE METABOLIC PANEL - Abnormal; Notable for the following components:   Sodium 132 (*)    Total Bilirubin 0.2 (*)    All other components within normal limits  TYPE AND SCREEN     IMAGES: MR Brain 07/18/2020 IMPRESSION: 1. No acute intracranial abnormality. 2. Old left cerebellar infarct.  EKG: 07/18/2020 Rate 109 bpm  Sinus tachycardia Nonspecific intraventricular conduction delay Nonspecific ST  abnormality  CV:  Past Medical History:  Diagnosis Date   Cancer ([place])    GERD (gastroesophageal reflux disease)    Hypertension 08/2020   Migraines    Seizure ([place])     Past Surgical History:  Procedure Laterality Date   BIOPSY  03/27/2020   Procedure: BIOPSY;  Surgeon: Harvel Quale, MD;  [facility];  Service: Gastroenterology;;   COLONOSCOPY WITH PROPOFOL N/A 03/27/2020   Procedure: COLONOSCOPY WITH PROPOFOL;  Surgeon: Harvel Quale, MD;  [facility];  Service: Gastroenterology;  Laterality: N/A;  730   ESOPHAGOGASTRODUODENOSCOPY (EGD) WITH PROPOFOL N/A 01/23/2020   Procedure: ESOPHAGOGASTRODUODENOSCOPY (EGD) WITH PROPOFOL;  Surgeon: Harvel Quale, MD;  [facility];  Service: Gastroenterology;  Laterality: N/A;   ESOPHAGOGASTRODUODENOSCOPY (EGD) WITH PROPOFOL N/A 03/27/2020   Procedure: ESOPHAGOGASTRODUODENOSCOPY (EGD) WITH PROPOFOL;  Surgeon: Harvel Quale, MD;  [facility];  Service: Gastroenterology;  Laterality: N/A;   left ankle surgery     POLYPECTOMY  03/27/2020   Procedure: POLYPECTOMY;  Surgeon: Harvel Quale, MD;  [facility];  Service: Gastroenterology;;   UMBILICAL HERNIA REPAIR N/A 06/06/2018   Procedure: HERNIA REPAIR UMBILICAL ADULT WITH MESH;  Surgeon: Aviva Signs, MD;  Location: [facility];  Service: General;  Laterality: N/A;    MEDICATIONS:  acetaminophen (TYLENOL) 325 MG tablet   amitriptyline (ELAVIL) 50 MG tablet   amLODipine (NORVASC) 10 MG tablet   divalproex (DEPAKOTE) 500 MG DR  tablet   EMGALITY 381 MG/ML SOAJ   folic acid (FOLVITE) 1 MG tablet   levETIRAcetam (KEPPRA) 1000 MG tablet   lisinopril (ZESTRIL) 10 MG tablet   ondansetron (ZOFRAN) 4 MG tablet   polyethylene glycol (MIRALAX / GLYCOLAX) 17 g packet   SUMAtriptan (IMITREX) 50 MG tablet   thiamine 100 MG tablet   zolpidem (AMBIEN) 5 MG tablet   zonisamide (ZONEGRAN) 100  MG capsule   No current facility-administered medications for this encounter.     Konrad Felix, PA-C [facility] [phone]

## 2020-09-27 NOTE — H&P (Signed)
Surgical Evaluation Referring provider: Dr. Harlow Asa, Dr. Dorris Fetch   Chief Complaint: adrenal mass   HPI: this is a very pleasant 50 year old man with a history of seizures which are not well controlled (occurring every few weeks, followed by neurology), who was referred by Dr. Dorris Fetch and Dr. Harlow Asa for consideration of robotic right adrenalectomy.  He has a enlarging right adrenal mass.  This was first identified in 2013 on a CT performed in the deep system for other reasons, and at that time it measured 2.5 cm in size.  He had another imaging study on January 22, 2020 for a suspected small bowel obstruction at any pending hospital at this point the mass had increased to 4.5 x 3.5 cm with a heterogenous appearance.  He has been evaluated by Dr. Dorris Fetch and has had a negative biochemical workup including metanephrines and cortisol.  He is not symptomatic from this mass.  However given the size and change in size/appearance over the last several years, surgical resection has been recommended for definitive diagnosis and treatment.   He is previously had an umbilical hernia repair with mesh but no other abdominal surgeries.   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       Allergies  Allergen Reactions   Morphine Nausea Only          Past Medical History:  Diagnosis Date   GERD (gastroesophageal reflux disease)     Migraines     Seizure [facility])             Past Surgical History:  Procedure Laterality Date   BIOPSY   03/27/2020    Procedure: BIOPSY;  Surgeon: Harvel Quale, MD;  [facility];  Service: Gastroenterology;;   COLONOSCOPY WITH PROPOFOL N/A 03/27/2020    Procedure: COLONOSCOPY WITH PROPOFOL;  Surgeon: Harvel Quale, MD;  [facility];  Service: Gastroenterology;  Laterality: N/A;  730   ESOPHAGOGASTRODUODENOSCOPY (EGD) WITH PROPOFOL N/A 01/23/2020    Procedure: ESOPHAGOGASTRODUODENOSCOPY (EGD)  WITH PROPOFOL;  Surgeon: Harvel Quale, MD;  [facility];  Service: Gastroenterology;  Laterality: N/A;   ESOPHAGOGASTRODUODENOSCOPY (EGD) WITH PROPOFOL N/A 03/27/2020    Procedure: ESOPHAGOGASTRODUODENOSCOPY (EGD) WITH PROPOFOL;  Surgeon: Harvel Quale, MD;  [facility];  Service: Gastroenterology;  Laterality: N/A;   POLYPECTOMY   03/27/2020    Procedure: POLYPECTOMY;  Surgeon: Harvel Quale, MD;  [facility];  Service: Gastroenterology;;   UMBILICAL HERNIA REPAIR N/A 06/06/2018    Procedure: HERNIA REPAIR UMBILICAL ADULT WITH MESH;  Surgeon: Aviva Signs, MD;  Location[facility];  Service: General;  Laterality: N/A;           Family History  Problem Relation Age of Onset   Heart disease Father        Social History         Socioeconomic History   Marital status: Single      Spouse name: Not on file   Number of children: 0   Years of education: Not on file   Highest education level: Not on file  Occupational History   Not on file  Tobacco Use   Smoking status: Current Every Day Smoker      Packs/day: 1.00   Smokeless tobacco: Current User      Types: Chew  Vaping Use   Vaping Use: Never used  Substance and Sexual Activity   Alcohol use: Yes  Comment: occasionally   Drug use: Yes      Types: Marijuana   Sexual activity: Yes      Partners: Female  Other Topics Concern   Not on file  Social History Narrative    Pt lives alone in 1 story home    No children    12th grade education    Past work - Ship broker / now on disability    Social Determinants of Adult nurse Strain: Not on Art therapist Insecurity: Not on file  Transportation Needs: Not on file  Physical Activity: Not on file  Stress: Not on file  Social Connections: Not on file            Current Outpatient Medications on File Prior to Visit  Medication Sig Dispense Refill   amitriptyline (ELAVIL) 50 MG  tablet TAKE 3 TABLETS (150 MG TOTAL) BY MOUTH AT BEDTIME. 270 tablet 1   dicyclomine (BENTYL) 10 MG capsule Take 1 capsule (10 mg total) by mouth every 8 (eight) hours as needed for spasms. 60 capsule 3   divalproex (DEPAKOTE) 500 MG DR tablet TAKE 2 TABLETS IN THE MORNING  AND TAKE 2 TABLETS AT NIGHT 360 tablet 3   EMGALITY 120 MG/ML SOAJ INJECT 120MG (1 PEN) INTO THE SKIN EVERY 30 DAYS 1 mL 11   ondansetron (ZOFRAN) 4 MG tablet Take 1 tablet (4 mg total) by mouth every 8 (eight) hours as needed for nausea or vomiting. 30 tablet 1   Oxcarbazepine (TRILEPTAL) 300 MG tablet TAKE 2 TABLETS TWICE DAILY 360 tablet 3   polyethylene glycol (MIRALAX / GLYCOLAX) 17 g packet Take 17 g by mouth daily. (Patient taking differently: Take 17 g by mouth in the morning and at bedtime.) 14 each 0   SUMAtriptan (IMITREX) 50 MG tablet Take 1 tablet (50 mg total) by mouth every 2 (two) hours as needed for migraine or headache. May repeat in 2 hours if headache persists or recurs. 10 tablet 11   zonisamide (ZONEGRAN) 100 MG capsule TAKE 4 CAPSULES AT BEDTIME. (Patient taking differently: Take 400 mg by mouth at bedtime.) 360 capsule 3    No current facility-administered medications on file prior to visit.      Review of Systems: a complete, 10pt review of systems was completed with pertinent positives and negatives as documented in the HPI   Physical Exam: Weight: 163.25 lb   Height: 65.5 in  Body Surface Area: 1.82 m   Body Mass Index: 26.75 kg/m   Temp.: 98.5 F    Pulse: 104 (Regular)    P.OX: 96% (Room air) BP: 120/80(Sitting, Left Arm, Standard)   Gen: A&Ox3, no distress Eyes: lids and conjunctivae normal, no icterus. Pupils equally round and reactive to light. Neck: supple without mass or thyromegaly Chest: respiratory effort is normal. No crepitus or tenderness on palpation of the chest. Breath sounds equal. Cardiovascular: RRR with palpable distal pulses, no pedal edema Gastrointestinal: soft,  nondistended, nontender. No mass, hepatomegaly or splenomegaly. No hernia. Lymphatic: no lymphadenopathy in the neck or groin Muscoloskeletal: no clubbing or cyanosis of the fingers.  Strength is symmetrical throughout.  Range of motion of bilateral upper and lower extremities normal without pain, crepitation or contracture. Neuro: cranial nerves grossly intact.  Sensation intact to light touch diffusely. Psych: appropriate mood and affect, normal insight/judgment intact Skin: warm and dry     CBC Latest Ref Rng & Units 01/23/2020 01/22/2020 01/22/2020  WBC 4.0 - 10.5 K/uL  11.5(H) 10.9(H) 16.8(H)  Hemoglobin 13.0 - 17.0 g/dL 11.4(L) 10.8(L) 12.7(L)  Hematocrit 39.0 - 52.0 % 33.7(L) 31.6(L) 37.9(L)  Platelets 150 - 400 K/uL 311 298 335      CMP Latest Ref Rng & Units 01/22/2020 07/18/2019 10/24/2018  Glucose 70 - 99 mg/dL 108(H) 129(H) 87  BUN 6 - 20 mg/dL 36(H) 8 10  Creatinine 0.61 - 1.24 mg/dL 0.94 1.14 1.04  Sodium 135 - 145 mmol/L 133(L) 127(L) 133(L)  Potassium 3.5 - 5.1 mmol/L 3.6 3.8 3.8  Chloride 98 - 111 mmol/L 99 90(L) 98  CO2 22 - 32 mmol/L 22 28 21(L)  Calcium 8.9 - 10.3 mg/dL 9.2 9.1 8.8(L)  Total Protein 6.5 - 8.1 g/dL 6.8 - 7.0  Total Bilirubin 0.3 - 1.2 mg/dL 0.5 - 0.7  Alkaline Phos 38 - 126 U/L 47 - 86  AST 15 - 41 U/L 15 - 27  ALT 0 - 44 U/L 13 - 15      Recent Labs       Lab Results  Component Value Date    INR 1.0 01/23/2020        Imaging: Imaging Results (Last 48 hours)  No results found.       A/P: enlarging right adrenal mass, asymptomatic and no apparent metabolic activity.  Given the change in size, heterogenous appearance,  I do recommend excision for pathological evaluation.  The robotic approach would be ideal.  I met with the patient today after he had consulted with Dr. Harlow Asa. We discussed the relevant anatomy and I explained the procedure in detail using a diagram to demonstrate, and we discussed the risks of bleeding, infection, injury to  intra-abdominal or retroperitoneal structures, cardiovascular/pulmonary/thromboembolic complications of general anesthesia and surgery, as well as risk of exacerbating his seizure disorder.  Discussed that this would typically require one night in the hospital.  Questions were welcomed and answered.  We'll proceed with scheduling for robotic right adrenalectomy.   Addendum: patient was scheduled but case canceled when he had a seizure while checking in to Preop. Presents for second attempt.            Patient Active Problem List    Diagnosis Date Noted   IBS (irritable bowel syndrome) 03/26/2020   Current smoker 02/27/2020   Noninfectious gastroenteritis and colitis 02/15/2020   History of duodenal ulcer 02/15/2020   Adrenal nodule ([place]) 02/15/2020   Constipation     Abnormal CT scan, colon     Melena 01/22/2020   Abdominal pain 01/22/2020   Nausea & vomiting 01/22/2020   Guaiac + stool 16/01/9603   Umbilical hernia without obstruction and without gangrene     Chronic daily headache 10/27/2017   Localization-related symptomatic epilepsy and epileptic syndromes with complex partial seizures, intractable, without status epilepticus ([place]) 05/05/2016   Seizure-like activity ([place]) 10/29/2015   Alcohol withdrawal ([place]) 10/19/2014   Sleep disorder 06/21/2014   Headache 10/31/2013   Seizures ([place]) 10/31/2013          Romana Juniper, [facility], PA   See AMION to contact appropriate on-call provider

## 2020-09-29 MED ORDER — BUPIVACAINE LIPOSOME 1.3 % IJ SUSP
20.0000 mL | INTRAMUSCULAR | Status: DC
  Filled 2020-09-29: qty 20

## 2020-09-29 NOTE — Anesthesia Preprocedure Evaluation (Addendum)
Anesthesia Evaluation  Patient identified by MRN, date of birth, ID band Patient awake    Reviewed: Allergy & Precautions, NPO status , Patient's Chart, lab work & pertinent test results  Airway Mallampati: III  TM Distance: >3 FB Neck ROM: Full    Dental no notable dental hx.    Pulmonary Current Smoker and Patient abstained from smoking., former smoker,    Pulmonary exam normal breath sounds clear to auscultation       Cardiovascular hypertension, Pt. on medications Normal cardiovascular exam Rhythm:Regular Rate:Normal  ECG: ST   Neuro/Psych  Headaches, Seizures -,  PSYCHIATRIC DISORDERS    GI/Hepatic Neg liver ROS,   Endo/Other  negative endocrine ROS  Renal/GU negative Renal ROS     Musculoskeletal negative musculoskeletal ROS (+)   Abdominal   Peds  Hematology negative hematology ROS (+)   Anesthesia Other Findings ADRENAL MASS  Reproductive/Obstetrics                           Anesthesia Physical Anesthesia Plan  ASA: 3  Anesthesia Plan: General   Post-op Pain Management:    Induction: Intravenous  PONV Risk Score and Plan: 2 and Ondansetron, Dexamethasone, Midazolam and Treatment may vary due to age or medical condition  Airway Management Planned: Oral ETT  Additional Equipment:   Intra-op Plan:   Post-operative Plan: Extubation in OR  Informed Consent: I have reviewed the patients History and Physical, chart, labs and discussed the procedure including the risks, benefits and alternatives for the proposed anesthesia with the patient or authorized representative who has indicated his/her understanding and acceptance.     Dental advisory given  Plan Discussed with: CRNA  Anesthesia Plan Comments: (Potential arterial line placement discussed )        Anesthesia Quick Evaluation

## 2020-09-30 ENCOUNTER — Inpatient Hospital Stay (HOSPITAL_COMMUNITY): Payer: Medicare HMO | Admitting: Anesthesiology

## 2020-09-30 ENCOUNTER — Inpatient Hospital Stay (HOSPITAL_COMMUNITY): Payer: Medicare HMO | Admitting: Physician Assistant

## 2020-09-30 ENCOUNTER — Encounter (HOSPITAL_COMMUNITY)
Admission: RE | Disposition: A | Discharge status: Home / Self Care | Payer: Self-pay | Source: Home / Self Care | Attending: Surgery

## 2020-09-30 ENCOUNTER — Other Ambulatory Visit: Payer: Self-pay

## 2020-09-30 ENCOUNTER — Inpatient Hospital Stay (HOSPITAL_COMMUNITY)
Admission: RE | Admit: 2020-09-30 | Discharge: 2020-10-01 | DRG: 615 | Disposition: A | Discharge status: Home / Self Care | Payer: Medicare HMO | Attending: Surgery | Admitting: Surgery

## 2020-09-30 ENCOUNTER — Encounter (HOSPITAL_COMMUNITY): Payer: Self-pay | Admitting: Surgery

## 2020-09-30 DIAGNOSIS — F17200 Nicotine dependence, unspecified, uncomplicated: Secondary | ICD-10-CM | POA: Diagnosis present

## 2020-09-30 DIAGNOSIS — R569 Unspecified convulsions: Secondary | ICD-10-CM | POA: Diagnosis present

## 2020-09-30 DIAGNOSIS — Z8249 Family history of ischemic heart disease and other diseases of the circulatory system: Secondary | ICD-10-CM

## 2020-09-30 DIAGNOSIS — Z8711 Personal history of peptic ulcer disease: Secondary | ICD-10-CM | POA: Diagnosis not present

## 2020-09-30 DIAGNOSIS — G43909 Migraine, unspecified, not intractable, without status migrainosus: Secondary | ICD-10-CM | POA: Diagnosis present

## 2020-09-30 DIAGNOSIS — E278 Other specified disorders of adrenal gland: Secondary | ICD-10-CM | POA: Diagnosis present

## 2020-09-30 DIAGNOSIS — K589 Irritable bowel syndrome without diarrhea: Secondary | ICD-10-CM | POA: Diagnosis present

## 2020-09-30 DIAGNOSIS — Z79899 Other long term (current) drug therapy: Secondary | ICD-10-CM | POA: Diagnosis not present

## 2020-09-30 DIAGNOSIS — K219 Gastro-esophageal reflux disease without esophagitis: Secondary | ICD-10-CM | POA: Diagnosis present

## 2020-09-30 DIAGNOSIS — E279 Disorder of adrenal gland, unspecified: Secondary | ICD-10-CM | POA: Diagnosis present

## 2020-09-30 HISTORY — PX: ROBOTIC ADRENALECTOMY: SHX6407

## 2020-09-30 LAB — TYPE AND SCREEN
ABO/RH(D): A POS
Antibody Screen: NEGATIVE

## 2020-09-30 SURGERY — ADRENALECTOMY, ROBOT-ASSISTED
Anesthesia: General | Site: Abdomen | Laterality: Right

## 2020-09-30 MED ORDER — LIDOCAINE HCL 2 % IJ SOLN
INTRAMUSCULAR | Status: AC
  Filled 2020-09-30: qty 20

## 2020-09-30 MED ORDER — AMITRIPTYLINE HCL 50 MG PO TABS: 150.0000 mg | ORAL_TABLET | Freq: Every day | ORAL | Status: DC

## 2020-09-30 MED ORDER — BUPIVACAINE-EPINEPHRINE 0.25% -1:200000 IJ SOLN
INTRAMUSCULAR | Status: AC
  Filled 2020-09-30: qty 1

## 2020-09-30 MED ORDER — PROMETHAZINE HCL 25 MG/ML IJ SOLN: 6.2500 mg | INTRAMUSCULAR | Status: DC | PRN

## 2020-09-30 MED ORDER — 0.9 % SODIUM CHLORIDE (POUR BTL) OPTIME
TOPICAL | Status: DC | PRN
  Administered 2020-09-30: 1000 mL

## 2020-09-30 MED ORDER — LIDOCAINE 20MG/ML (2%) 15 ML SYRINGE OPTIME: INTRAMUSCULAR | Status: DC | PRN

## 2020-09-30 MED ORDER — SUGAMMADEX SODIUM 200 MG/2ML IV SOLN
INTRAVENOUS | Status: DC | PRN
  Administered 2020-09-30: 200 mg via INTRAVENOUS

## 2020-09-30 MED ORDER — LIDOCAINE 2% (20 MG/ML) 5 ML SYRINGE
INTRAMUSCULAR | Status: DC | PRN
  Administered 2020-09-30: 60 mg via INTRAVENOUS

## 2020-09-30 MED ORDER — OXYCODONE HCL 5 MG PO TABS
5.0000 mg | ORAL_TABLET | Freq: Four times a day (QID) | ORAL | Status: DC | PRN
Start: 2020-09-30 — End: 2020-10-01
  Administered 2020-09-30 – 2020-10-01 (×2): 5 mg via ORAL
  Filled 2020-09-30 (×2): qty 1

## 2020-09-30 MED ORDER — THIAMINE HCL 100 MG PO TABS
100.0000 mg | ORAL_TABLET | Freq: Every day | ORAL | Status: DC
  Administered 2020-09-30 – 2020-10-01 (×2): 100 mg via ORAL
  Filled 2020-09-30 (×2): qty 1

## 2020-09-30 MED ORDER — PROPOFOL 10 MG/ML IV BOLUS
INTRAVENOUS | Status: AC
  Filled 2020-09-30: qty 20

## 2020-09-30 MED ORDER — ORAL CARE MOUTH RINSE: 15.0000 mL | Freq: Once | OROMUCOSAL | Status: AC

## 2020-09-30 MED ORDER — AMITRIPTYLINE HCL 50 MG PO TABS
150.0000 mg | ORAL_TABLET | Freq: Every day | ORAL | Status: DC
  Administered 2020-09-30: 150 mg via ORAL
  Filled 2020-09-30: qty 3

## 2020-09-30 MED ORDER — METOPROLOL TARTRATE 5 MG/5ML IV SOLN: 5.0000 mg | Freq: Four times a day (QID) | INTRAVENOUS | Status: DC | PRN

## 2020-09-30 MED ORDER — METHOCARBAMOL 1000 MG/10ML IJ SOLN
500.0000 mg | Freq: Four times a day (QID) | INTRAVENOUS | Status: DC
  Administered 2020-09-30 – 2020-10-01 (×3): 500 mg via INTRAVENOUS
  Filled 2020-09-30 (×3): qty 500

## 2020-09-30 MED ORDER — METHOCARBAMOL 500 MG PO TABS
500.0000 mg | ORAL_TABLET | Freq: Four times a day (QID) | ORAL | Status: DC | PRN
  Administered 2020-09-30 (×2): 500 mg via ORAL
  Filled 2020-09-30 (×2): qty 1

## 2020-09-30 MED ORDER — LACTATED RINGERS IV SOLN: INTRAVENOUS | Status: DC | PRN

## 2020-09-30 MED ORDER — HYDROMORPHONE HCL 1 MG/ML IJ SOLN
0.5000 mg | INTRAMUSCULAR | Status: DC | PRN
  Administered 2020-09-30: 0.5 mg via INTRAVENOUS
  Filled 2020-09-30: qty 0.5

## 2020-09-30 MED ORDER — FENTANYL CITRATE (PF) 100 MCG/2ML IJ SOLN
INTRAMUSCULAR | Status: AC
  Filled 2020-09-30: qty 2

## 2020-09-30 MED ORDER — ONDANSETRON 4 MG PO TBDP
4.0000 mg | ORAL_TABLET | Freq: Four times a day (QID) | ORAL | Status: DC | PRN
Start: 2020-09-30 — End: 2020-10-01

## 2020-09-30 MED ORDER — SUMATRIPTAN SUCCINATE 50 MG PO TABS
50.0000 mg | ORAL_TABLET | ORAL | Status: DC | PRN
  Filled 2020-09-30: qty 1

## 2020-09-30 MED ORDER — DOCUSATE SODIUM 100 MG PO CAPS: 100.0000 mg | ORAL_CAPSULE | Freq: Two times a day (BID) | ORAL | Status: DC

## 2020-09-30 MED ORDER — BUPIVACAINE-EPINEPHRINE 0.25% -1:200000 IJ SOLN
INTRAMUSCULAR | Status: DC | PRN
  Administered 2020-09-30: 30 mL

## 2020-09-30 MED ORDER — DIPHENHYDRAMINE HCL 25 MG PO CAPS: 25.0000 mg | ORAL_CAPSULE | Freq: Four times a day (QID) | ORAL | Status: DC | PRN

## 2020-09-30 MED ORDER — HEMOSTATIC AGENTS (NO CHARGE) OPTIME
TOPICAL | Status: DC | PRN
  Administered 2020-09-30: 1

## 2020-09-30 MED ORDER — KETAMINE HCL 10 MG/ML IJ SOLN
INTRAMUSCULAR | Status: AC
  Filled 2020-09-30: qty 1

## 2020-09-30 MED ORDER — SIMETHICONE 80 MG PO CHEW: 40.0000 mg | CHEWABLE_TABLET | Freq: Four times a day (QID) | ORAL | Status: DC | PRN

## 2020-09-30 MED ORDER — LISINOPRIL 10 MG PO TABS: 10.0000 mg | ORAL_TABLET | Freq: Every day | ORAL | Status: DC

## 2020-09-30 MED ORDER — PHENYLEPHRINE 40 MCG/ML (10ML) SYRINGE FOR IV PUSH (FOR BLOOD PRESSURE SUPPORT)
PREFILLED_SYRINGE | INTRAVENOUS | Status: DC | PRN
  Administered 2020-09-30 (×2): 80 ug via INTRAVENOUS

## 2020-09-30 MED ORDER — LORAZEPAM 1 MG PO TABS: 1.0000 mg | ORAL_TABLET | ORAL | Status: DC | PRN

## 2020-09-30 MED ORDER — CHLORHEXIDINE GLUCONATE CLOTH 2 % EX PADS: 6.0000 | MEDICATED_PAD | Freq: Once | CUTANEOUS | Status: DC

## 2020-09-30 MED ORDER — ROCURONIUM BROMIDE 10 MG/ML (PF) SYRINGE
PREFILLED_SYRINGE | INTRAVENOUS | Status: DC | PRN
  Administered 2020-09-30 (×2): 20 mg via INTRAVENOUS
  Administered 2020-09-30 (×2): 60 mg via INTRAVENOUS
  Administered 2020-09-30: 40 mg via INTRAVENOUS

## 2020-09-30 MED ORDER — ADULT MULTIVITAMIN W/MINERALS CH
1.0000 | ORAL_TABLET | Freq: Every day | ORAL | Status: DC
  Administered 2020-09-30 – 2020-10-01 (×2): 1 via ORAL
  Filled 2020-09-30 (×2): qty 1

## 2020-09-30 MED ORDER — MIDAZOLAM HCL 2 MG/2ML IJ SOLN
INTRAMUSCULAR | Status: AC
  Filled 2020-09-30: qty 2

## 2020-09-30 MED ORDER — LIDOCAINE 2% (20 MG/ML) 5 ML SYRINGE
INTRAMUSCULAR | Status: AC
  Filled 2020-09-30: qty 5

## 2020-09-30 MED ORDER — MIDAZOLAM HCL 2 MG/2ML IJ SOLN
INTRAMUSCULAR | Status: DC | PRN
  Administered 2020-09-30: 2 mg via INTRAVENOUS

## 2020-09-30 MED ORDER — ENSURE PRE-SURGERY PO LIQD
296.0000 mL | Freq: Once | ORAL | Status: DC
  Filled 2020-09-30: qty 296

## 2020-09-30 MED ORDER — HYDRALAZINE HCL 20 MG/ML IJ SOLN: 10.0000 mg | INTRAMUSCULAR | Status: DC | PRN

## 2020-09-30 MED ORDER — PROPOFOL 10 MG/ML IV BOLUS
INTRAVENOUS | Status: DC | PRN
  Administered 2020-09-30: 200 mg via INTRAVENOUS

## 2020-09-30 MED ORDER — ONDANSETRON HCL 4 MG/2ML IJ SOLN
INTRAMUSCULAR | Status: AC
  Filled 2020-09-30: qty 2

## 2020-09-30 MED ORDER — BISACODYL 10 MG RE SUPP: 10.0000 mg | Freq: Every day | RECTAL | Status: DC | PRN

## 2020-09-30 MED ORDER — FENTANYL CITRATE (PF) 250 MCG/5ML IJ SOLN
INTRAMUSCULAR | Status: DC | PRN
  Administered 2020-09-30: 100 ug via INTRAVENOUS
  Administered 2020-09-30 (×3): 50 ug via INTRAVENOUS

## 2020-09-30 MED ORDER — BUPIVACAINE-EPINEPHRINE (PF) 0.25% -1:200000 IJ SOLN
INTRAMUSCULAR | Status: AC
  Filled 2020-09-30: qty 30

## 2020-09-30 MED ORDER — ACETAMINOPHEN 500 MG PO TABS
1000.0000 mg | ORAL_TABLET | ORAL | Status: AC
  Administered 2020-09-30: 1000 mg via ORAL
  Filled 2020-09-30: qty 2

## 2020-09-30 MED ORDER — ACETAMINOPHEN 500 MG PO TABS
1000.0000 mg | ORAL_TABLET | Freq: Four times a day (QID) | ORAL | Status: DC
  Administered 2020-09-30 – 2020-10-01 (×5): 1000 mg via ORAL
  Filled 2020-09-30 (×6): qty 2

## 2020-09-30 MED ORDER — HYDROMORPHONE HCL 1 MG/ML IJ SOLN
1.0000 mg | INTRAMUSCULAR | Status: DC | PRN
  Administered 2020-09-30: 2 mg via INTRAVENOUS
  Administered 2020-09-30 – 2020-10-01 (×3): 1 mg via INTRAVENOUS
  Filled 2020-09-30: qty 1
  Filled 2020-09-30: qty 2
  Filled 2020-09-30 (×2): qty 1

## 2020-09-30 MED ORDER — OXYCODONE HCL 5 MG/5ML PO SOLN: 5.0000 mg | Freq: Once | ORAL | Status: DC | PRN

## 2020-09-30 MED ORDER — CHLORHEXIDINE GLUCONATE 0.12 % MT SOLN
15.0000 mL | Freq: Once | OROMUCOSAL | Status: AC
  Administered 2020-09-30: 15 mL via OROMUCOSAL

## 2020-09-30 MED ORDER — ZOLPIDEM TARTRATE 5 MG PO TABS
5.0000 mg | ORAL_TABLET | Freq: Every evening | ORAL | Status: DC | PRN
  Administered 2020-09-30: 5 mg via ORAL
  Filled 2020-09-30: qty 1

## 2020-09-30 MED ORDER — SODIUM CHLORIDE 0.9 % IV SOLN: INTRAVENOUS | Status: DC

## 2020-09-30 MED ORDER — ROCURONIUM BROMIDE 10 MG/ML (PF) SYRINGE
PREFILLED_SYRINGE | INTRAVENOUS | Status: AC
  Filled 2020-09-30: qty 10

## 2020-09-30 MED ORDER — AMLODIPINE BESYLATE 10 MG PO TABS
10.0000 mg | ORAL_TABLET | Freq: Every day | ORAL | Status: DC
  Administered 2020-10-01: 10 mg via ORAL
  Filled 2020-09-30: qty 1

## 2020-09-30 MED ORDER — ZONISAMIDE 100 MG PO CAPS
500.0000 mg | ORAL_CAPSULE | Freq: Every day | ORAL | Status: DC
  Administered 2020-09-30: 500 mg via ORAL
  Filled 2020-09-30: qty 5

## 2020-09-30 MED ORDER — CEFAZOLIN SODIUM-DEXTROSE 2-4 GM/100ML-% IV SOLN
2.0000 g | INTRAVENOUS | Status: AC
  Administered 2020-09-30: 2 g via INTRAVENOUS
  Filled 2020-09-30: qty 100

## 2020-09-30 MED ORDER — EPHEDRINE SULFATE-NACL 50-0.9 MG/10ML-% IV SOSY
PREFILLED_SYRINGE | INTRAVENOUS | Status: DC | PRN
  Administered 2020-09-30: 10 mg via INTRAVENOUS

## 2020-09-30 MED ORDER — OXYCODONE HCL 5 MG PO TABS: 5.0000 mg | ORAL_TABLET | Freq: Once | ORAL | Status: DC | PRN

## 2020-09-30 MED ORDER — DIVALPROEX SODIUM 250 MG PO DR TAB
1000.0000 mg | DELAYED_RELEASE_TABLET | Freq: Two times a day (BID) | ORAL | Status: DC
  Administered 2020-09-30 – 2020-10-01 (×3): 1000 mg via ORAL
  Filled 2020-09-30 (×3): qty 4

## 2020-09-30 MED ORDER — NICOTINE 14 MG/24HR TD PT24
14.0000 mg | MEDICATED_PATCH | Freq: Every day | TRANSDERMAL | Status: DC
  Administered 2020-09-30 – 2020-10-01 (×2): 14 mg via TRANSDERMAL
  Filled 2020-09-30 (×2): qty 1

## 2020-09-30 MED ORDER — KETOROLAC TROMETHAMINE 30 MG/ML IJ SOLN: 30.0000 mg | Freq: Once | INTRAMUSCULAR | Status: DC | PRN

## 2020-09-30 MED ORDER — LIDOCAINE 20MG/ML (2%) 15 ML SYRINGE OPTIME
INTRAMUSCULAR | Status: DC | PRN
  Administered 2020-09-30: 1.5 mg/kg/h via INTRAVENOUS

## 2020-09-30 MED ORDER — ENSURE PRE-SURGERY PO LIQD
592.0000 mL | Freq: Once | ORAL | Status: DC
  Filled 2020-09-30: qty 592

## 2020-09-30 MED ORDER — IBUPROFEN 400 MG PO TABS: 600.0000 mg | ORAL_TABLET | Freq: Four times a day (QID) | ORAL | Status: DC | PRN

## 2020-09-30 MED ORDER — POLYETHYLENE GLYCOL 3350 17 G PO PACK
17.0000 g | PACK | Freq: Every day | ORAL | Status: DC
  Administered 2020-09-30 – 2020-10-01 (×2): 17 g via ORAL
  Filled 2020-09-30 (×2): qty 1

## 2020-09-30 MED ORDER — ONDANSETRON HCL 4 MG/2ML IJ SOLN: 4.0000 mg | Freq: Four times a day (QID) | INTRAMUSCULAR | Status: DC | PRN

## 2020-09-30 MED ORDER — FENTANYL CITRATE (PF) 250 MCG/5ML IJ SOLN
INTRAMUSCULAR | Status: AC
  Filled 2020-09-30: qty 5

## 2020-09-30 MED ORDER — LEVETIRACETAM 500 MG PO TABS
1000.0000 mg | ORAL_TABLET | Freq: Two times a day (BID) | ORAL | Status: DC
  Administered 2020-09-30 – 2020-10-01 (×2): 1000 mg via ORAL
  Filled 2020-09-30 (×2): qty 2

## 2020-09-30 MED ORDER — LORAZEPAM 2 MG/ML IJ SOLN: 1.0000 mg | INTRAMUSCULAR | Status: DC | PRN

## 2020-09-30 MED ORDER — DEXAMETHASONE SODIUM PHOSPHATE 10 MG/ML IJ SOLN
INTRAMUSCULAR | Status: DC | PRN
  Administered 2020-09-30: 10 mg via INTRAVENOUS

## 2020-09-30 MED ORDER — PHENYLEPHRINE HCL (PRESSORS) 10 MG/ML IV SOLN
INTRAVENOUS | Status: AC
  Filled 2020-09-30: qty 1

## 2020-09-30 MED ORDER — DIPHENHYDRAMINE HCL 50 MG/ML IJ SOLN: 25.0000 mg | Freq: Four times a day (QID) | INTRAMUSCULAR | Status: DC | PRN

## 2020-09-30 MED ORDER — FENTANYL CITRATE (PF) 100 MCG/2ML IJ SOLN
25.0000 ug | INTRAMUSCULAR | Status: DC | PRN
  Administered 2020-09-30 (×2): 50 ug via INTRAVENOUS

## 2020-09-30 MED ORDER — AMISULPRIDE (ANTIEMETIC) 5 MG/2ML IV SOLN: 10.0000 mg | Freq: Once | INTRAVENOUS | Status: DC | PRN

## 2020-09-30 MED ORDER — KETAMINE HCL 10 MG/ML IJ SOLN
INTRAMUSCULAR | Status: DC | PRN
  Administered 2020-09-30: 30 mg via INTRAVENOUS

## 2020-09-30 MED ORDER — LACTATED RINGERS IR SOLN
Status: DC | PRN
  Administered 2020-09-30: 1000 mL

## 2020-09-30 MED ORDER — TRAMADOL HCL 50 MG PO TABS
50.0000 mg | ORAL_TABLET | Freq: Four times a day (QID) | ORAL | Status: DC | PRN
  Administered 2020-09-30 – 2020-10-01 (×2): 50 mg via ORAL
  Filled 2020-09-30 (×2): qty 1

## 2020-09-30 MED ORDER — LACTATED RINGERS IV SOLN: INTRAVENOUS | Status: DC

## 2020-09-30 MED ORDER — PHENYLEPHRINE HCL-NACL 10-0.9 MG/250ML-% IV SOLN
INTRAVENOUS | Status: DC | PRN
  Administered 2020-09-30: 50 ug/min via INTRAVENOUS

## 2020-09-30 MED ORDER — BUPIVACAINE LIPOSOME 1.3 % IJ SUSP
INTRAMUSCULAR | Status: DC | PRN
  Administered 2020-09-30: 20 mL

## 2020-09-30 MED ORDER — AMITRIPTYLINE HCL 50 MG PO TABS
150.0000 mg | ORAL_TABLET | Freq: Every day | ORAL | Status: DC
  Filled 2020-09-30: qty 1

## 2020-09-30 MED ORDER — FOLIC ACID 1 MG PO TABS
1.0000 mg | ORAL_TABLET | Freq: Every day | ORAL | Status: DC
  Administered 2020-09-30 – 2020-10-01 (×2): 1 mg via ORAL
  Filled 2020-09-30 (×2): qty 1

## 2020-09-30 SURGICAL SUPPLY — 64 items
APPLIER CLIP 5 13 M/L LIGAMAX5 (MISCELLANEOUS)
BLADE SURG SZ11 CARB STEEL (BLADE) ×2 IMPLANT
CANNULA REDUC XI 12-8 STAPL (CANNULA)
CANNULA REDUCER 12-8 DVNC XI (CANNULA) IMPLANT
CHLORAPREP W/TINT 26 (MISCELLANEOUS) ×2 IMPLANT
CLIP APPLIE 5 13 M/L LIGAMAX5 (MISCELLANEOUS) IMPLANT
CLIP VESOLOCK LG 6/CT PURPLE (CLIP) ×2 IMPLANT
CONNECTOR 5 IN 1 STRAIGHT STRL (MISCELLANEOUS) ×2 IMPLANT
COVER SURGICAL LIGHT HANDLE (MISCELLANEOUS) ×2 IMPLANT
COVER TIP SHEARS 8 DVNC (MISCELLANEOUS) ×1 IMPLANT
COVER TIP SHEARS 8MM DA VINCI (MISCELLANEOUS) ×1
COVER WAND RF STERILE (DRAPES) IMPLANT
DECANTER SPIKE VIAL GLASS SM (MISCELLANEOUS) ×2 IMPLANT
DERMABOND ADVANCED (GAUZE/BANDAGES/DRESSINGS) ×1
DERMABOND ADVANCED .7 DNX12 (GAUZE/BANDAGES/DRESSINGS) ×1 IMPLANT
DEVICE TROCAR PUNCTURE CLOSURE (ENDOMECHANICALS) ×2 IMPLANT
DRAIN CHANNEL 19F RND (DRAIN) IMPLANT
DRAPE ARM DVNC X/XI (DISPOSABLE) ×4 IMPLANT
DRAPE COLUMN DVNC XI (DISPOSABLE) ×1 IMPLANT
DRAPE DA VINCI XI ARM (DISPOSABLE) ×4
DRAPE DA VINCI XI COLUMN (DISPOSABLE) ×2
DRAPE SHEET LG 3/4 BI-LAMINATE (DRAPES) IMPLANT
DRAPE WARM FLUID 44X44 (DRAPES) IMPLANT
DRSG TEGADERM 2-3/8X2-3/4 SM (GAUZE/BANDAGES/DRESSINGS) ×6 IMPLANT
DRSG TEGADERM 4X4.75 (GAUZE/BANDAGES/DRESSINGS) IMPLANT
DRSG TEGADERM 6X8 (GAUZE/BANDAGES/DRESSINGS) ×2 IMPLANT
ELECT REM PT RETURN 15FT ADLT (MISCELLANEOUS) ×2 IMPLANT
GAUZE SPONGE 2X2 8PLY STRL LF (GAUZE/BANDAGES/DRESSINGS) ×1 IMPLANT
GAUZE SPONGE 4X4 12PLY STRL (GAUZE/BANDAGES/DRESSINGS) IMPLANT
GLOVE SURG LTX SZ8 (GLOVE) ×4 IMPLANT
GLOVE SURG UNDER LTX SZ8 (GLOVE) ×4 IMPLANT
GOWN STRL REUS W/TWL XL LVL3 (GOWN DISPOSABLE) ×6 IMPLANT
HEMOSTAT SNOW SURGICEL 2X4 (HEMOSTASIS) ×2 IMPLANT
KIT BASIN OR (CUSTOM PROCEDURE TRAY) ×2 IMPLANT
KIT TURNOVER KIT A (KITS) ×2 IMPLANT
NEEDLE HYPO 22GX1.5 SAFETY (NEEDLE) ×2 IMPLANT
NEEDLE INSUFFLATION 14GA 120MM (NEEDLE) IMPLANT
PACK CARDIOVASCULAR III (CUSTOM PROCEDURE TRAY) ×2 IMPLANT
PENCIL SMOKE EVACUATOR (MISCELLANEOUS) IMPLANT
POUCH RETRIEVAL ECOSAC 10 (ENDOMECHANICALS) ×1 IMPLANT
POUCH RETRIEVAL ECOSAC 10MM (ENDOMECHANICALS) ×2
SCISSORS LAP 5X35 DISP (ENDOMECHANICALS) ×2 IMPLANT
SEAL CANN UNIV 5-8 DVNC XI (MISCELLANEOUS) ×4 IMPLANT
SEAL XI 5MM-8MM UNIVERSAL (MISCELLANEOUS) ×4
SEALER VESSEL DA VINCI XI (MISCELLANEOUS) ×2
SEALER VESSEL EXT DVNC XI (MISCELLANEOUS) ×1 IMPLANT
SET IRRIG TUBING LAPAROSCOPIC (IRRIGATION / IRRIGATOR) ×2 IMPLANT
SOLUTION ELECTROLUBE (MISCELLANEOUS) ×2 IMPLANT
SPONGE GAUZE 2X2 STER 10/PKG (GAUZE/BANDAGES/DRESSINGS) ×1
SPONGE LAP 18X18 RF (DISPOSABLE) ×2 IMPLANT
STAPLER SHEATH (SHEATH)
STAPLER SHEATH ENDOWRIST DVNC (SHEATH) IMPLANT
SUT MNCRL AB 4-0 PS2 18 (SUTURE) ×2 IMPLANT
SUT PDS AB 1 CTX 36 (SUTURE) IMPLANT
SUT PROLENE 2 0 KS (SUTURE) ×2 IMPLANT
SUT VICRYL 0 TIES 12 18 (SUTURE) ×2 IMPLANT
SYR 10ML ECCENTRIC (SYRINGE) ×2 IMPLANT
SYR 20ML LL LF (SYRINGE) ×2 IMPLANT
TOWEL OR 17X26 10 PK STRL BLUE (TOWEL DISPOSABLE) ×2 IMPLANT
TOWEL OR NON WOVEN STRL DISP B (DISPOSABLE) ×2 IMPLANT
TRAY FOLEY MTR SLVR 16FR STAT (SET/KITS/TRAYS/PACK) ×2 IMPLANT
TROCAR ADV FIXATION 5X100MM (TROCAR) ×2 IMPLANT
TUBING CONNECTING 10 (TUBING) ×2 IMPLANT
TUBING INSUFFLATION 10FT LAP (TUBING) ×2 IMPLANT

## 2020-09-30 NOTE — Anesthesia Postprocedure Evaluation (Signed)
Anesthesia Post Note  Patient: Matthew Nguyen  Procedure(s) Performed: XI ROBOTIC RIGHT ADRENALECTOMY (Right: Abdomen)     Patient location during evaluation: PACU Anesthesia Type: General Level of consciousness: awake Pain management: pain level controlled Vital Signs Assessment: post-procedure vital signs reviewed and stable Respiratory status: spontaneous breathing, nonlabored ventilation, respiratory function stable and patient connected to nasal cannula oxygen Cardiovascular status: blood pressure returned to baseline and stable Postop Assessment: no apparent nausea or vomiting Anesthetic complications: no   No notable events documented.  Last Vitals:  Vitals:   09/30/20 1343 09/30/20 1502  BP: 117/90 (!) 139/102  Pulse: (!) 103 97  Resp: 18 18  Temp: 37.1 C 36.7 C  SpO2: 100% 98%    Last Pain:  Vitals:   09/30/20 1608  TempSrc:   PainSc: 8                  Chihiro Frey P Bentlie Catanzaro

## 2020-09-30 NOTE — Anesthesia Procedure Notes (Addendum)
Procedure Name: Intubation Date/Time: 09/30/2020 7:33 AM Performed by: Lollie Sails, CRNA Pre-anesthesia Checklist: Patient identified, Emergency Drugs available, Suction available and Patient being monitored Patient Re-evaluated:Patient Re-evaluated prior to induction Oxygen Delivery Method: Circle system utilized Preoxygenation: Pre-oxygenation with 100% oxygen Induction Type: IV induction Ventilation: Mask ventilation without difficulty Laryngoscope Size: Mac and 3 Grade View: Grade I Tube size: 7.5 mm Number of attempts: 1 Airway Equipment and Method: Stylet Placement Confirmation: ETT inserted through vocal cords under direct vision, positive ETCO2, CO2 detector and breath sounds checked- equal and bilateral Secured at: 23 cm Dental Injury: Teeth and Oropharynx as per pre-operative assessment  Comments: DL x1 by Charyl Bigger, SRNA

## 2020-09-30 NOTE — Op Note (Signed)
Operative Note  Matthew Nguyen  [id number]  [id number]  09/30/2020   Surgeon: Clovis Riley MD   Assistant: Ralene Ok MD   Procedure performed: xi Robotic right adrenalectomy   Preop diagnosis: Enlarging, nonfunctional right adrenal mass Post-op diagnosis/intraop findings: same   Specimens: Right adrenal gland Retained items: no EBL: 30 cc Complications: none   Description of procedure: After obtaining informed consent the patient was taken to the operating room and placed supine on operating room table where general endotracheal anesthesia was initiated, preoperative antibiotics were administered, SCDs applied, and a formal timeout was performed. Foley catheter was inserted which is removed at the end of the case.  The patient was then repositioned in the left lateral decubitus with all pressure points appropriately padded and the bed flexed to open up the space between the rib cage and the iliac crest.  The abdomen was prepped and draped in usual sterile fashion.  Peritoneal access was gained with a Veress needle in the right upper quadrant and insufflation to 15 m mercury achieved without incident.  An 8 mm robotic trocar and camera were then inserted and the abdominal cavity inspected and confirmed to be free of injury.  3 additional 8 mm trochars were placed under direct visualization parallel with the costal margin and a 5 mm assistant port was placed in the right lower quadrant.  The robot was then docked and instruments inserted under direct visualization.  The liver was retracted anteriorly and the triangular ligament taken down as well as the anterior peritoneum overlying the easily visible adrenal mass incised.  Dissection began at the medial aspect, carefully separating the adrenal tissue from the IVC medially and the renal vein inferiorly.  This proceeded until the adrenal vein was identified at the superior aspect of the dissection.  This was isolated, clipped and divided.  A  small additional branch was identified and this was clipped proximally and divided distally with cautery.  Combination of blunt dissection, cautery and vessel sealer were then used to dissect the adrenal and associated tumor from the surrounding soft tissue.  Hemostasis was excellent.  A piece of Surgicel was placed in the dissection bed. Once the specimen was freed it was placed in an Endo Catch bag and removed through the right paramedian port site which did have to be extended slightly.  The fascia here was then closed with several interrupted 0 Vicryls using a laparoscopic suture passer under direct visualization.  The abdominal cavity was then reinspected, hemostasis confirmed and confirmed no evidence of injury to the viscera or liver.  A laparoscopic assisted taps block was performed for pain control using Exparel mixed with quarter percent Marcaine and epinephrine.  The abdomen was then desufflated and trochars removed.  The deeper tissues at the extraction port were closed with interrupted 2-0 Vicryl's.  Skin incisions were then closed with subcuticular 4-0 Monocryl and Dermabond. The patient was then returned to the supine position, awakened, extubated and taken to PACU in stable condition.    All counts were correct at the completion of the case.

## 2020-09-30 NOTE — Transfer of Care (Signed)
Immediate Anesthesia Transfer of Care Note  Patient: Matthew Nguyen  Procedure(s) Performed: XI ROBOTIC RIGHT ADRENALECTOMY (Right: Abdomen)  Patient Location: PACU  Anesthesia Type:General  Level of Consciousness: awake, alert , oriented and patient cooperative  Airway & Oxygen Therapy: Patient connected to face mask  Post-op Assessment: Report given to RN and Post -op Vital signs reviewed and stable  Post vital signs: Reviewed and stable  Last Vitals:  Vitals Value Taken Time  BP 128/95 09/30/20 0953  Temp 36.5 C 09/30/20 0953  Pulse 101 09/30/20 0955  Resp 14 09/30/20 0955  SpO2 100 % 09/30/20 0955  Vitals shown include unvalidated device data.  Last Pain:  Vitals:   09/30/20 0558  TempSrc:   PainSc: 0-No pain      Patients Stated Pain Goal: 3 (77/93/96 8864)  Complications: No notable events documented.

## 2020-10-01 ENCOUNTER — Telehealth: Payer: Self-pay | Admitting: Neurology

## 2020-10-01 ENCOUNTER — Other Ambulatory Visit (HOSPITAL_COMMUNITY): Payer: Self-pay

## 2020-10-01 ENCOUNTER — Encounter (HOSPITAL_COMMUNITY): Payer: Self-pay | Admitting: Surgery

## 2020-10-01 LAB — BASIC METABOLIC PANEL
Anion gap: 6 (ref 5–15)
BUN: 8 mg/dL (ref 6–20)
CO2: 27 mmol/L (ref 22–32)
Calcium: 8.4 mg/dL — ABNORMAL LOW (ref 8.9–10.3)
Chloride: 101 mmol/L (ref 98–111)
Creatinine, Ser: 0.88 mg/dL (ref 0.61–1.24)
GFR, Estimated: >60 mL/min (ref >60)
Glucose, Bld: 100 mg/dL — ABNORMAL HIGH (ref 70–99)
Potassium: 3.6 mmol/L (ref 3.5–5.1)
Sodium: 134 mmol/L — ABNORMAL LOW (ref 135–145)

## 2020-10-01 LAB — CBC
HCT: 35 % — ABNORMAL LOW (ref 39.0–52.0)
Hemoglobin: 11.6 g/dL — ABNORMAL LOW (ref 13.0–17.0)
MCH: 32.4 pg (ref 26.0–34.0)
MCHC: 33.1 g/dL (ref 30.0–36.0)
MCV: 97.8 fL (ref 80.0–100.0)
Platelets: 198 10*3/uL (ref 150–400)
RBC: 3.58 MIL/uL — ABNORMAL LOW (ref 4.22–5.81)
RDW: 14.8 % (ref 11.5–15.5)
WBC: 9.8 10*3/uL (ref 4.0–10.5)
nRBC: 0 % (ref 0.0–0.2)

## 2020-10-01 LAB — MAGNESIUM: Magnesium: 2 mg/dL (ref 1.7–2.4)

## 2020-10-01 MED ORDER — OXYCODONE HCL 5 MG PO TABS
5.0000 mg | ORAL_TABLET | Freq: Four times a day (QID) | ORAL | Status: DC | PRN
  Administered 2020-10-01 (×2): 10 mg via ORAL
  Filled 2020-10-01 (×2): qty 2

## 2020-10-01 MED ORDER — OXYCODONE HCL 5 MG PO TABS
5.0000 mg | ORAL_TABLET | Freq: Four times a day (QID) | ORAL | 0 refills | Status: AC | PRN
  Filled 2020-10-01: qty 20, 3d supply, fill #0

## 2020-10-01 MED ORDER — IBUPROFEN 400 MG PO TABS
800.0000 mg | ORAL_TABLET | Freq: Three times a day (TID) | ORAL | Status: DC
  Administered 2020-10-01: 800 mg via ORAL
  Filled 2020-10-01: qty 2

## 2020-10-01 MED ORDER — POTASSIUM CHLORIDE 20 MEQ PO PACK
40.0000 meq | PACK | Freq: Once | ORAL | Status: AC
  Administered 2020-10-01: 40 meq via ORAL
  Filled 2020-10-01: qty 2

## 2020-10-01 MED ORDER — HYDROMORPHONE HCL 1 MG/ML IJ SOLN: 1.0000 mg | INTRAMUSCULAR | Status: DC | PRN

## 2020-10-01 MED ORDER — ACETAMINOPHEN 500 MG PO TABS
1000.0000 mg | ORAL_TABLET | Freq: Four times a day (QID) | ORAL | 0 refills | Status: AC
  Filled 2020-10-01: qty 30, 4d supply, fill #0

## 2020-10-01 MED ORDER — METHOCARBAMOL 500 MG PO TABS
500.0000 mg | ORAL_TABLET | Freq: Four times a day (QID) | ORAL | 0 refills | Status: AC
  Filled 2020-10-01: qty 20, 5d supply, fill #0

## 2020-10-01 MED ORDER — IBUPROFEN 800 MG PO TABS
800.0000 mg | ORAL_TABLET | Freq: Three times a day (TID) | ORAL | 0 refills | Status: AC
  Filled 2020-10-01: qty 10, 4d supply, fill #0

## 2020-10-01 NOTE — Plan of Care (Signed)
Patient discharged home in stable condition 

## 2020-10-01 NOTE — Telephone Encounter (Signed)
Pharmacy noted as current therapy.

## 2020-10-01 NOTE — Progress Notes (Addendum)
S: Difficulty with pain control, heating pad is helping.  Worst at extraction port site and radiating down towards the groin and into the back.  Denies any nausea, endorses flatus.   O: Vitals, labs, intake/output, and orders reviewed at this time.  Afebrile, heart rate 88-105, normo to hypertensive, 97% on room air. Po 240; uop 2375.  BMP/mag reviewed-hypokalemia 3.6.  Wbc 9.8 (8.2 preop), hemoglobin 11.6 (13.6), platelet 198 (213)   Gen: A&Ox3, no distress  H&N: EOMI, atraumatic, neck supple Chest: unlabored respirations, RRR Abd: soft, appropriately tender around incisions, nondistended, incision(s) c/d/i with Dermabond, no cellulitis or hematoma.  Crepitus in subcu tissue  Ext: warm, no edema Neuro: grossly normal  Lines/tubes/drains: PIV  A/P: POD 1 s/p robotic right adrenalectomy Continue regular diet, work on pain control without IV medications, mobilize this morning.  If doing well can discharge later today. Mild hypokalemia 3.6-replace p.o. Mild drop in hemoglobin- likely dilutional. No clinical evidence of ongoing bleeding, minimal intraop blood loss.   Romana Juniper, MD [facility], [place]

## 2020-10-01 NOTE — TOC Transition Note (Signed)
Transition of Care [facility]) - CM/SW Discharge Note  Patient Details  Name: Matthew Nguyen MRN: [id number] Date of Birth: November 16, 1970  Transition of Care [facility]) CM/SW Contact:  Sherie Don, LCSW Phone Number: 10/01/2020, 12:00 PM  Clinical Narrative: TOC received consult for SA. CSW spoke with patient, who declined resources at this time. TOC signing off.  Final next level of care: Home/Self Care Barriers to Discharge: No Barriers Identified  Patient Goals and CMS Choice Patient states their goals for this hospitalization and ongoing recovery are:: Return home Choice offered to / list presented to : NA  Discharge Plan and Services        DME Arranged: N/A DME Agency: NA  Readmission Risk Interventions No flowsheet data found.

## 2020-10-01 NOTE — Discharge Summary (Signed)
Physician Discharge Summary  Patient ID: Matthew Nguyen MRN: [id number] DOB/AGE: 1970/10/13 50 y.o.  Admit date: 09/30/2020 Discharge date: 10/01/2020  Admission Diagnoses: right adrenal mass  Discharge Diagnoses:  Active Problems:   Adrenal mass [facility])   Discharged Condition: good  Hospital Course: Admitted for routine post-operative care following uneventful robotic right adrenalectomy. On post-op day 1 the patient was tolerating a regular diet, ambulating well, having bowel function, and pain was controlled with oral medications.   Treatments: robotic right adrenalectomy  Discharge Exam: Blood pressure 111/76, pulse 98, temperature 97.7 F (36.5 C), temperature source Oral, resp. rate 18, height 5\' 8"  (1.727 m), weight 72.6 kg, SpO2 96 %. See rounding note  Disposition: Discharge disposition: 01-Home or Self Care        Allergies as of 10/01/2020       Reactions   Morphine Nausea And Vomiting        Medication List     TAKE these medications    acetaminophen 325 MG tablet Commonly known as: TYLENOL Take 650 mg by mouth every 6 (six) hours as needed for mild pain, fever or headache. What changed: Another medication with the same name was added. Make sure you understand how and when to take each.   acetaminophen 500 MG tablet Commonly known as: TYLENOL Take 2 tablets (1,000 mg total) by mouth every 6 (six) hours for 4 days. Take tylenol scheduled for the first few days, then take as needed What changed: You were already taking a medication with the same name, and this prescription was added. Make sure you understand how and when to take each.   amitriptyline 50 MG tablet Commonly known as: ELAVIL TAKE 3 TABLETS (150 MG TOTAL) BY MOUTH AT BEDTIME.   amLODipine 10 MG tablet Commonly known as: NORVASC Take 1 tablet (10 mg total) by mouth daily.   folic acid 1 MG tablet Commonly known as: FOLVITE Take 1 tablet (1 mg total) by mouth daily.   ibuprofen 800 MG  tablet Commonly known as: ADVIL Take 1 tablet (800 mg total) by mouth 3 (three) times daily. Take as scheduled for the first couple days, then take as needed. Do not exceed the recommended dose limit.   levETIRAcetam 1000 MG tablet Commonly known as: Keppra Take 1 tablet (1,000 mg total) by mouth 2 (two) times daily.   lisinopril 10 MG tablet Commonly known as: ZESTRIL Take 1 tablet (10 mg total) by mouth daily. Notes to patient: Resume 10/02/20   methocarbamol 500 MG tablet Commonly known as: Robaxin Take 1 tablet (500 mg total) by mouth 4 (four) times daily.   oxyCODONE 5 MG immediate release tablet Commonly known as: Oxy IR/ROXICODONE Take 1-2 tablets (5-10 mg total) by mouth every 6 (six) hours as needed for severe pain (Please minimize narcotic).   polyethylene glycol 17 g packet Commonly known as: MIRALAX / GLYCOLAX Take 17 g by mouth daily.   thiamine 100 MG tablet Take 1 tablet (100 mg total) by mouth daily.   zolpidem 5 MG tablet Commonly known as: AMBIEN Take 5 mg by mouth at bedtime as needed for sleep.   zonisamide 100 MG capsule Commonly known as: ZONEGRAN Take 5 capsules (500 mg total) by mouth at bedtime.       ASK your doctor about these medications    divalproex 500 MG DR tablet Commonly known as: DEPAKOTE TAKE 2 TABLETS IN THE MORNING  AND TAKE 2 TABLETS AT NIGHT   Emgality 120 MG/ML Soaj Generic drug:  Galcanezumab-gnlm INJECT 120MG  (1 PEN) INTO THE SKIN EVERY 30 DAYS   SUMAtriptan 50 MG tablet Commonly known as: IMITREX TAKE 1 TABLET EVERY TWO HOURS AS NEEDED FOR MIGRAINE OR HEADACHE. MAY REPEAT IN 2 HOURS IF HEADACHE PERSISTS OR RECURS        Follow-up Information     Clovis Riley, MD Follow up in 3 week(s).   Specialty: General Surgery Contact information: [address] [place] [address] [phone]                 Signed: Clovis Riley 10/01/2020, 11:52 AM

## 2020-10-01 NOTE — Telephone Encounter (Signed)
Would like to follow up on pts medication list. Needs an updated list of meds. Please call [phone]

## 2020-10-01 NOTE — Discharge Instructions (Signed)
POST OP INSTRUCTIONS   EAT Gradually transition to a high fiber diet with a fiber supplement over the next few weeks after discharge.  Start with a pureed / full liquid diet (see below)  WALK Walk an hour a day.  Control your pain to do that.    CONTROL PAIN Control pain so that you can walk, sleep, tolerate sneezing/coughing, go up/down stairs.  HAVE A BOWEL MOVEMENT DAILY Keep your bowels regular to avoid problems.  OK to try a laxative to override constipation.  OK to use an antidairrheal to slow down diarrhea.  Call if not better after 2 tries  CALL IF YOU HAVE PROBLEMS/CONCERNS Call if you are still struggling despite following these instructions. Call if you have concerns not answered by these instructions    DIET: Follow a light bland diet & liquids the first 24 hours after arrival home, such as soup, liquids, starches, etc.  Be sure to drink plenty of fluids.  Quickly advance to a usual solid diet within a few days.  Avoid fast food or heavy meals as your are more likely to get nauseated or have irregular bowels.  A low-sugar, high-fiber diet for the rest of your life is ideal.  Take your usually prescribed home medications unless otherwise directed.  PAIN CONTROL: Pain is best controlled by a usual combination of three different methods TOGETHER: Ice/Heat Over the counter pain medication Prescription pain medication Most patients will experience some swelling and bruising around the incisions.  Ice packs or heating pads (30-60 minutes up to 6 times a day) will help. Use ice for the first few days to help decrease swelling and bruising, then switch to heat to help relax tight/sore spots and speed recovery.  Some people prefer to use ice alone, heat alone, alternating between ice & heat.  Experiment to what works for you.  Swelling and bruising can take several weeks to resolve.   It is helpful to take an over-the-counter pain medication regularly for the first few  days: Naproxen (Aleve, etc)  Two 220mg  tabs twice a day OR Ibuprofen (Advil, etc) Three 200mg  tabs four times a day (every meal & bedtime) AND Acetaminophen (Tylenol, etc) 500-650mg  four times a day (every meal & bedtime) A  prescription for pain medication (such as oxycodone, hydrocodone, tramadol, gabapentin, methocarbamol, etc) should be given to you upon discharge.  Take your pain medication as prescribed, IF NEEDED.  If you are having problems/concerns with the prescription medicine (does not control pain, nausea, vomiting, rash, itching, etc), please call us [phone] to see if we need to switch you to a different pain medicine that will work better for you and/or control your side effect better. If you need a refill on your pain medication, please give [place] 48 hour notice.  contact your pharmacy.  They will contact our office to request authorization. Prescriptions will not be filled after 5 pm or on week-ends  Avoid getting constipated.   Between the surgery and the pain medications, it is common to experience some constipation.   Increasing fluid intake and taking a fiber supplement (such as Metamucil, Citrucel, FiberCon, MiraLax, etc) 1-2 times a day regularly will usually help prevent this problem from occurring.   A mild laxative (prune juice, Milk of Magnesia, MiraLax, etc) should be taken according to package directions if there are no bowel movements after 48 hours.   Watch out for diarrhea.   If you have many loose bowel movements, simplify your diet to bland  foods & liquids for a few days.   Stop any stool softeners and decrease your fiber supplement.   Switching to mild anti-diarrheal medications (Kayopectate, Pepto Bismol) can help.   If this worsens or does not improve, please call us.  Wash / shower every day.  You may shower over the skin glue which is waterproof  Glue will flake off after about 2 weeks.  You may leave the incision open to air.  You may replace a  dressing/Band-Aid to cover the incision for comfort if you wish. Do not submerge under water until completely healed.   ACTIVITIES as tolerated:   You may resume regular (light) daily activities beginning the next day--such as daily self-care, walking, climbing stairs--gradually increasing activities as tolerated.  If you can walk 30 minutes without difficulty, it is safe to try more intense activity such as jogging, treadmill, bicycling, low-impact aerobics, swimming, etc. Save the most intensive and strenuous activity for last such as sit-ups, heavy lifting, contact sports, etc  Refrain from any heavy lifting or straining until you are off narcotics for pain control.   DO NOT PUSH THROUGH PAIN.  Let pain be your guide: If it hurts to do something, don't do it.  Pain is your body warning you to avoid that activity for another week until the pain goes down. You may drive when you are no longer taking prescription pain medication, you can comfortably wear a seatbelt, and you can safely maneuver your car and apply brakes. You may have sexual intercourse when it is comfortable.  FOLLOW UP in our office Please call [facility] at [phone] to set up an appointment to see your surgeon in the office for a follow-up appointment approximately 2-3 weeks after your surgery. Make sure that you call for this appointment the day you arrive home to insure a convenient appointment time.  10. IF YOU HAVE DISABILITY OR FAMILY LEAVE FORMS, BRING THEM TO THE OFFICE FOR PROCESSING.  DO NOT GIVE THEM TO YOUR DOCTOR.   WHEN TO CALL us [phone]: Poor pain control Reactions / problems with new medications (rash/itching, nausea, etc)  Fever over 101.5 F (38.5 C) Inability to urinate Nausea and/or vomiting Worsening swelling or bruising Continued bleeding from incision. Increased pain, redness, or drainage from the incision   The clinic staff is available to answer your questions during regular business hours  (8:30am-5pm).  Please don't hesitate to call and ask to speak to one of our nurses for clinical concerns.   If you have a medical emergency, go to the nearest emergency room or call 911.  A surgeon from [facility] is always on call at the [facility], [place], [place], [place], [place]  [address] ? MAIN: [phone] ? TOLL FREE: [phone] ?  FAX [phone] [email or web address]

## 2020-10-02 ENCOUNTER — Other Ambulatory Visit (HOSPITAL_COMMUNITY): Payer: Self-pay

## 2020-10-02 LAB — SURGICAL PATHOLOGY

## 2020-12-06 ENCOUNTER — Other Ambulatory Visit: Payer: Self-pay

## 2020-12-06 ENCOUNTER — Ambulatory Visit (INDEPENDENT_AMBULATORY_CARE_PROVIDER_SITE_OTHER): Payer: Medicare HMO | Admitting: Neurology

## 2020-12-06 ENCOUNTER — Encounter: Payer: Self-pay | Admitting: Neurology

## 2020-12-06 VITALS — BP 114/80 | HR 94 | Ht 68.0 in | Wt 159.4 lb

## 2020-12-06 DIAGNOSIS — G40019 Localization-related (focal) (partial) idiopathic epilepsy and epileptic syndromes with seizures of localized onset, intractable, without status epilepticus: Secondary | ICD-10-CM | POA: Diagnosis not present

## 2020-12-06 DIAGNOSIS — R519 Headache, unspecified: Secondary | ICD-10-CM | POA: Diagnosis not present
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
## 2020-12-09 ENCOUNTER — Other Ambulatory Visit: Payer: Self-pay

## 2020-12-09 DIAGNOSIS — G40219 Localization-related (focal) (partial) symptomatic epilepsy and epileptic syndromes with complex partial seizures, intractable, without status epilepticus: Secondary | ICD-10-CM

## 2020-12-12 DEATH — deceased

## 2021-04-09 ENCOUNTER — Ambulatory Visit: Payer: Medicare HMO | Admitting: Neurology

## 2021-04-17 ENCOUNTER — Ambulatory Visit (INDEPENDENT_AMBULATORY_CARE_PROVIDER_SITE_OTHER): Payer: Medicare HMO | Admitting: Gastroenterology

## 2021-11-24 IMAGING — MR MR HEAD W/O CM
12 series · 48 of 48 positions shown · non-contrast
Comparison: None.

CLINICAL DATA: Seizure

EXAM:
MRI HEAD WITHOUT CONTRAST
TECHNIQUE: Multiplanar, multiecho pulse sequences of the brain and surrounding
structures were obtained without intravenous contrast.

[Series 5: DWI · axial · 3.0mm · 1.36mm/px · z∈[-26,+117]mm · 5 of 100 slices shown (1 of 4)]
[im 1/100]
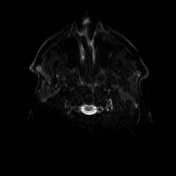
[im 25/100]
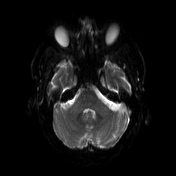
[im 50/100]
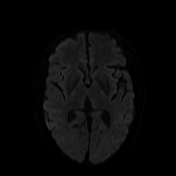
[im 75/100]
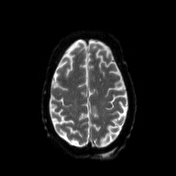
[im 100/100]
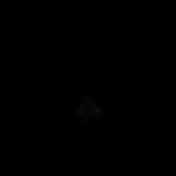

[Series 6: DWI · axial · 3.0mm · 1.36mm/px · z∈[-26,+117]mm · 3 of 46 slices shown (2 of 4)]
[im 1/46]
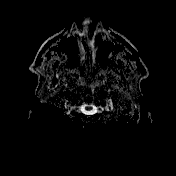
[im 23/46]
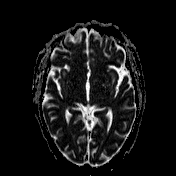
[im 46/46]
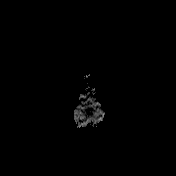

[Series 7: T1 · sagittal · 5.0mm · 0.75mm/px · 1 of 26 slices shown (1 of 3)]
[im 1/26]
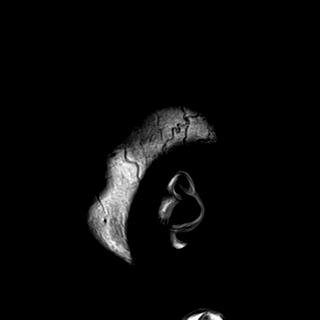

[Series 12: swi_images · axial · 3.0mm · 0.75mm/px · z∈[-30,+120]mm · 3 of 52 slices shown]
[im 1/52]
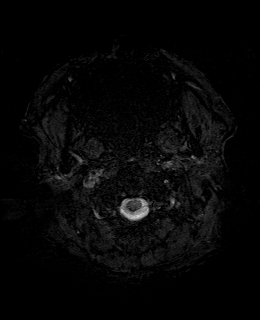
[im 26/52]
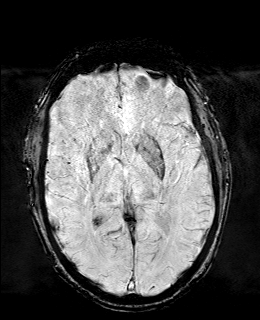
[im 52/52]
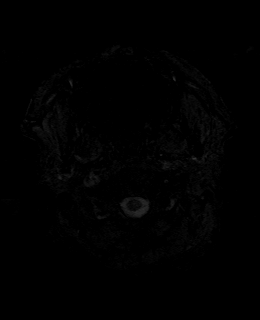

[Series 13: FLAIR · axial · 3.0mm · 0.75mm/px · z∈[-32,+121]mm · 3 of 53 slices shown (1 of 2)]
[im 1/53]
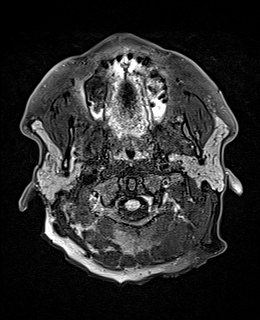
[im 27/53]
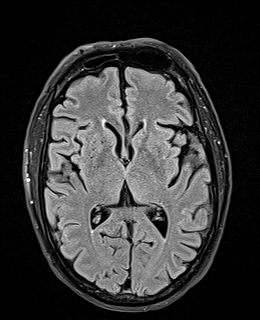
[im 53/53]
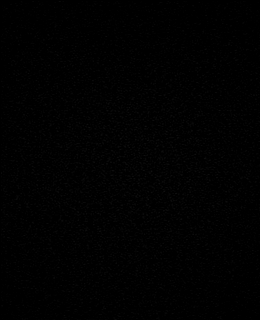

[Series 14: T1 · axial · 1.0mm · 0.94mm/px · z∈[-36,+119]mm · 9 of 160 slices shown (2 of 3)]
[im 1/160]
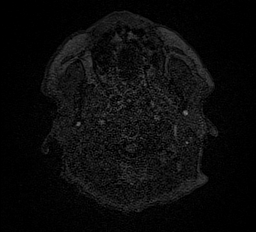
[im 20/160]
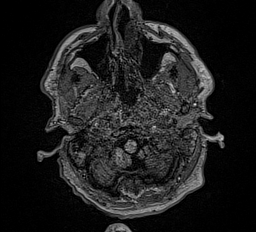
[im 40/160]
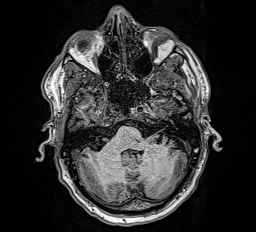
[im 60/160]
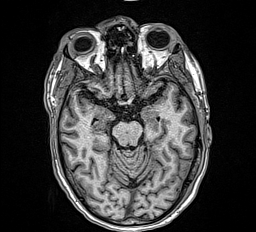
[im 80/160]
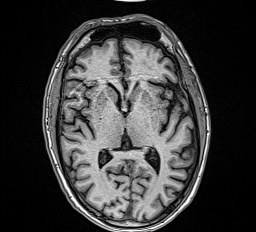
[im 100/160]
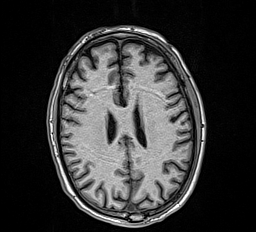
[im 120/160]
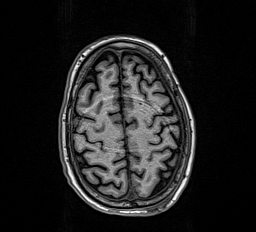
[im 140/160]
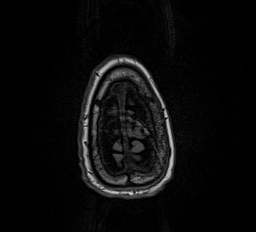
[im 160/160]
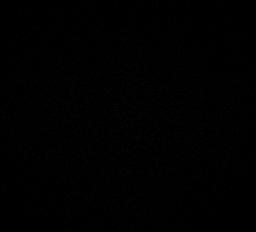

[Series 15: T2 · coronal · 3.0mm · 0.40mm/px · 3 of 57 slices shown (1 of 2)]
[im 1/57]
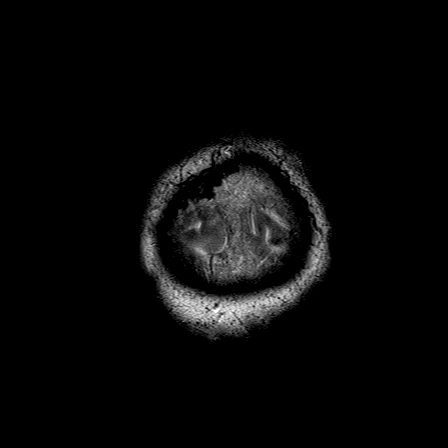
[im 29/57]
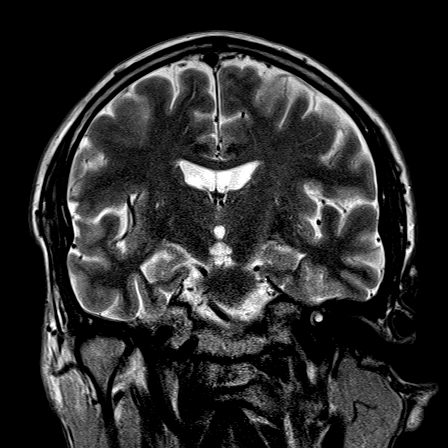
[im 57/57]
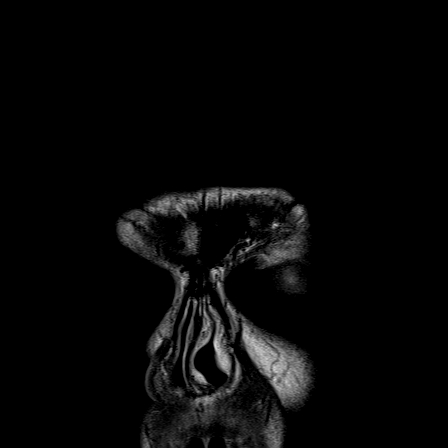

[Series 16: DWI · coronal · 5.0mm · 1.36mm/px · 4 of 79 slices shown (3 of 4)]
[im 1/79]
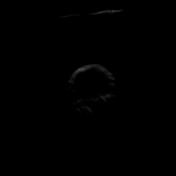
[im 27/79]
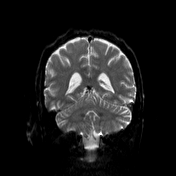
[im 53/79]
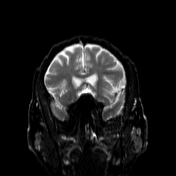
[im 79/79]
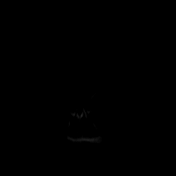

[Series 17: DWI · coronal · 5.0mm · 1.36mm/px · 2 of 40 slices shown (4 of 4)]
[im 1/40]
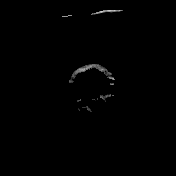
[im 40/40]
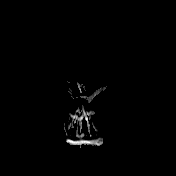

[Series 18: T2 · axial · 3.0mm · 0.57mm/px · z∈[-33,+119]mm · 3 of 53 slices shown (2 of 2)]
[im 1/53]
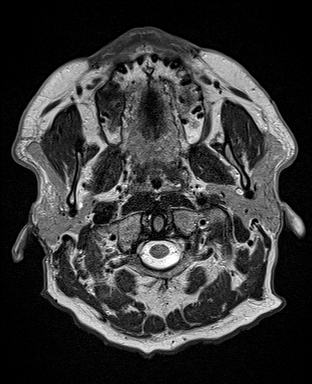
[im 27/53]
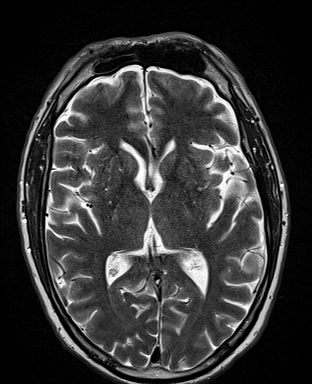
[im 53/53]
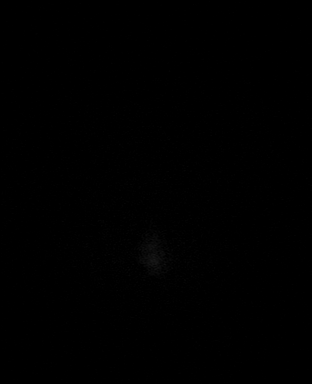

[Series 19: FLAIR · coronal · 3.0mm · 0.56mm/px · 3 of 57 slices shown (2 of 2)]
[im 1/57]
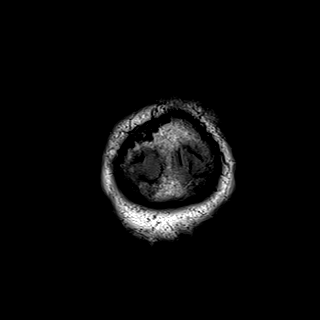
[im 29/57]
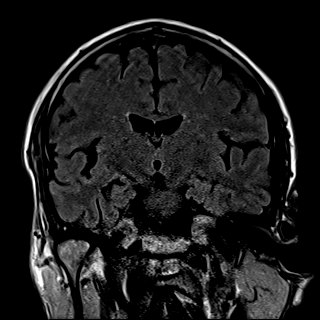
[im 57/57]
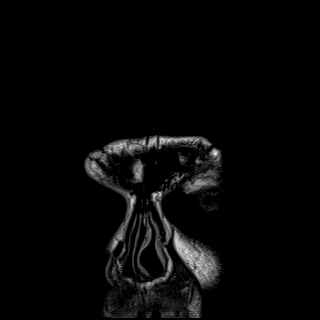

[Series 20: T1 · axial · 1.0mm · 0.94mm/px · z∈[-35,+120]mm · 9 of 160 slices shown (3 of 3)]
[im 1/160]
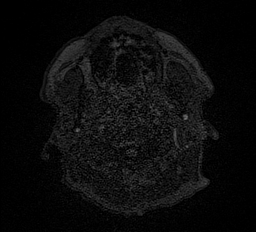
[im 20/160]
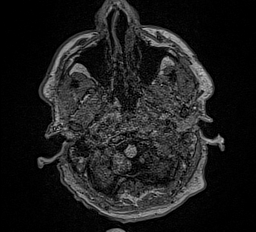
[im 40/160]
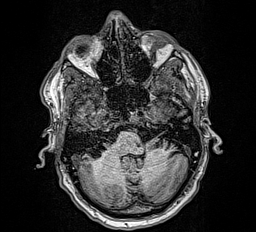
[im 60/160]
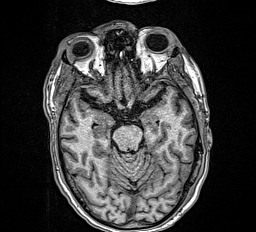
[im 80/160]
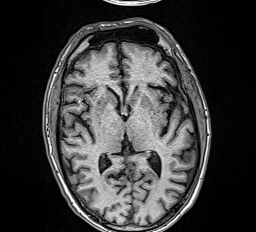
[im 100/160]
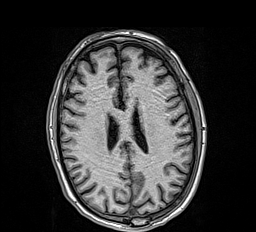
[im 120/160]
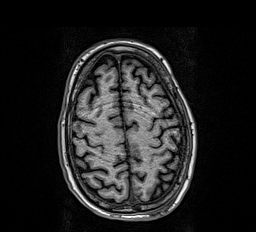
[im 140/160]
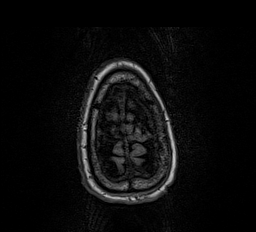
[im 160/160]
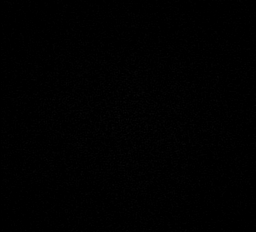

[48 of 48 positions shown; findings below may reference images not displayed]

FINDINGS: Brain: No acute infarct, mass effect or extra-axial collection. No
acute hemorrhage. Minimal multifocal hyperintense T2-weight signal
within the white matter. Old left cerebellar infarct.

Vascular: Major flow voids are preserved.

Skull and upper cervical spine: Normal calvarium and skull base.
Visualized upper cervical spine and soft tissues are normal.

Sinuses/Orbits:Bilateral mastoid fluid.  Normal orbits.
IMPRESSION: 1. No acute intracranial abnormality.
2. Old left cerebellar infarct.

## 2022-02-27 ENCOUNTER — Encounter (INDEPENDENT_AMBULATORY_CARE_PROVIDER_SITE_OTHER): Payer: Self-pay | Admitting: *Deleted
